# Patient Record
Sex: Female | Born: 1940
Health system: Southern US, Community
[De-identification: ages and names within clinical notes are randomized; demographics above are authoritative.]

## PROBLEM LIST (undated history)

## (undated) DIAGNOSIS — Z800 Family history of malignant neoplasm of digestive organs: Secondary | ICD-10-CM

## (undated) DIAGNOSIS — Z8042 Family history of malignant neoplasm of prostate: Secondary | ICD-10-CM

## (undated) DIAGNOSIS — I10 Essential (primary) hypertension: Secondary | ICD-10-CM

## (undated) DIAGNOSIS — B259 Cytomegaloviral disease, unspecified: Secondary | ICD-10-CM

## (undated) DIAGNOSIS — Z808 Family history of malignant neoplasm of other organs or systems: Secondary | ICD-10-CM

## (undated) DIAGNOSIS — Z803 Family history of malignant neoplasm of breast: Secondary | ICD-10-CM

## (undated) DIAGNOSIS — IMO0001 Reserved for inherently not codable concepts without codable children: Secondary | ICD-10-CM

## (undated) DIAGNOSIS — E7800 Pure hypercholesterolemia, unspecified: Secondary | ICD-10-CM

## (undated) DIAGNOSIS — K52831 Collagenous colitis: Secondary | ICD-10-CM

## (undated) HISTORY — DX: Pure hypercholesterolemia, unspecified: E78.00

## (undated) HISTORY — DX: Family history of malignant neoplasm of prostate: Z80.42

## (undated) HISTORY — DX: Family history of malignant neoplasm of breast: Z80.3

## (undated) HISTORY — PX: TOTAL ABDOMINAL HYSTERECTOMY W/ BILATERAL SALPINGOOPHORECTOMY: SHX83

## (undated) HISTORY — DX: Family history of malignant neoplasm of digestive organs: Z80.0

## (undated) HISTORY — DX: Essential (primary) hypertension: I10

## (undated) HISTORY — PX: APPENDECTOMY: SHX54

## (undated) HISTORY — DX: Collagenous colitis: K52.831

## (undated) HISTORY — PX: ROTATOR CUFF REPAIR: SHX139

## (undated) HISTORY — DX: Reserved for inherently not codable concepts without codable children: IMO0001

## (undated) HISTORY — DX: Family history of malignant neoplasm of other organs or systems: Z80.8

## (undated) HISTORY — DX: Cytomegaloviral disease, unspecified: B25.9

## (undated) HISTORY — PX: CATARACT EXTRACTION, BILATERAL: SHX1313

---

## 1970-06-08 HISTORY — PX: CHOLECYSTECTOMY: SHX55

## 1998-06-19 ENCOUNTER — Other Ambulatory Visit: Admission: RE | Admit: 1998-06-19 | Discharge: 1998-06-19 | Payer: Self-pay | Admitting: *Deleted

## 1998-10-29 ENCOUNTER — Inpatient Hospital Stay (HOSPITAL_COMMUNITY): Admission: RE | Admit: 1998-10-29 | Discharge: 1998-10-30 | Payer: Self-pay | Admitting: Orthopedic Surgery

## 2000-04-06 ENCOUNTER — Ambulatory Visit (HOSPITAL_COMMUNITY): Admission: RE | Admit: 2000-04-06 | Discharge: 2000-04-06 | Payer: Self-pay | Admitting: Gastroenterology

## 2000-06-21 ENCOUNTER — Other Ambulatory Visit: Admission: RE | Admit: 2000-06-21 | Discharge: 2000-06-21 | Payer: Self-pay | Admitting: *Deleted

## 2000-07-30 ENCOUNTER — Encounter: Admission: RE | Admit: 2000-07-30 | Discharge: 2000-07-30 | Payer: Self-pay | Admitting: Sports Medicine

## 2007-03-18 DIAGNOSIS — IMO0001 Reserved for inherently not codable concepts without codable children: Secondary | ICD-10-CM

## 2007-03-18 HISTORY — DX: Reserved for inherently not codable concepts without codable children: IMO0001

## 2010-02-03 ENCOUNTER — Ambulatory Visit: Payer: Self-pay | Admitting: Cardiology

## 2010-02-03 ENCOUNTER — Ambulatory Visit (HOSPITAL_COMMUNITY): Admission: RE | Admit: 2010-02-03 | Discharge: 2010-02-03 | Payer: Self-pay | Admitting: Cardiology

## 2010-02-03 DIAGNOSIS — IMO0001 Reserved for inherently not codable concepts without codable children: Secondary | ICD-10-CM

## 2010-02-03 HISTORY — DX: Reserved for inherently not codable concepts without codable children: IMO0001

## 2010-02-04 ENCOUNTER — Ambulatory Visit: Payer: Self-pay | Admitting: Cardiology

## 2010-10-07 ENCOUNTER — Other Ambulatory Visit: Payer: Self-pay | Admitting: *Deleted

## 2010-10-07 DIAGNOSIS — Z792 Long term (current) use of antibiotics: Secondary | ICD-10-CM

## 2010-10-07 NOTE — Telephone Encounter (Signed)
[place] the [place], requesting zpak

## 2010-10-08 MED ORDER — AZITHROMYCIN 250 MG PO TABS: ORAL_TABLET | ORAL | Status: AC

## 2010-10-24 NOTE — Procedures (Signed)
[place]. [facility]  Patient:    Stephanie Liu, Stephanie Liu                     MRN: [id number] Proc. Date: 04/06/00 Adm. Date:  [id number] Attending:  Rich Brave CC:         Colleen Can. Deborah Chalk, M.D.   Procedure Report  PROCEDURE PERFORMED:  Colonoscopy.  ENDOSCOPIST:  Florencia Reasons, M.D.  INDICATIONS FOR PROCEDURE:  The patient is a 70 year old female with a intermittent small volume hematochezia.  FINDINGS:  Normal exam to the cecum.  DESCRIPTION OF PROCEDURE:  The nature, purpose and risks of the procedure had been discussed with the patient, who provided written consent.  Sedation was fentanyl 60 mcg, Versed 7 mg IV without arrhythmias or desaturation.  The Olympus adjustable tension pediatric videocolonoscope was advanced quite easily to the base of the cecum as identified by the visualization of the appendiceal orifice, after which pullback was performed in a gradual fashion. The quality of the prep was excellent and it is felt that all areas were well seen.  This was a normal examination.  No polyps, cancer, colitis, vascular malformations or diverticulosis were observed and retroflexion in the rectum was normal.  No biopsies were obtained.  The patient tolerated the procedure well and there were no apparent complications.  IMPRESSION:  Normal colonoscopy.  Occasional rectal bleeding is presumably due to hemorrhoidal origin or perhaps intermittent anal fissuring, given the absence of alternative explanation on todays exam.  PLAN:  At the discretion of her primary physician, consideration could be given to a follow-up colonoscopy in about 10 years, perhaps with a flexible sigmoidoscopy in five years.  PLAN: Consider follow-up colonoscopy in five years in view of the family history of colon cancer. DD:  04/06/00 TD:  04/06/00 Job: [id number]

## 2011-08-24 ENCOUNTER — Encounter: Payer: Self-pay | Admitting: *Deleted

## 2011-08-24 DIAGNOSIS — E785 Hyperlipidemia, unspecified: Secondary | ICD-10-CM

## 2011-08-24 DIAGNOSIS — I10 Essential (primary) hypertension: Secondary | ICD-10-CM | POA: Insufficient documentation

## 2011-10-09 ENCOUNTER — Encounter: Payer: Self-pay | Admitting: Cardiology

## 2011-10-19 ENCOUNTER — Encounter: Payer: Self-pay | Admitting: Cardiology

## 2011-11-10 ENCOUNTER — Encounter: Payer: Self-pay | Admitting: Cardiology

## 2011-11-11 ENCOUNTER — Encounter: Payer: Self-pay | Admitting: Cardiology

## 2011-12-09 ENCOUNTER — Other Ambulatory Visit: Payer: Self-pay | Admitting: Nurse Practitioner

## 2011-12-09 MED ORDER — DOXYCYCLINE HYCLATE 100 MG PO TABS: 100.0000 mg | ORAL_TABLET | Freq: Two times a day (BID) | ORAL | Status: AC

## 2011-12-09 NOTE — Progress Notes (Signed)
Patient has had recent tick bite. Leaving for trip to [place]. Will give 14 days of Doxycycline as preventative therapy.

## 2012-10-07 ENCOUNTER — Encounter: Payer: Self-pay | Admitting: Cardiology

## 2014-07-19 ENCOUNTER — Encounter: Payer: Self-pay | Admitting: Cardiology

## 2015-06-13 MED FILL — BYSTOLIC 5 MG TABLET: 5 | 90 days supply | Qty: 90 | Fill #2

## 2015-06-20 DIAGNOSIS — Z780 Asymptomatic menopausal state: Secondary | ICD-10-CM | POA: Diagnosis not present

## 2015-07-03 DIAGNOSIS — D485 Neoplasm of uncertain behavior of skin: Secondary | ICD-10-CM | POA: Diagnosis not present

## 2015-07-03 DIAGNOSIS — L300 Nummular dermatitis: Secondary | ICD-10-CM | POA: Diagnosis not present

## 2015-07-03 DIAGNOSIS — L570 Actinic keratosis: Secondary | ICD-10-CM | POA: Diagnosis not present

## 2015-07-03 DIAGNOSIS — L814 Other melanin hyperpigmentation: Secondary | ICD-10-CM | POA: Diagnosis not present

## 2015-07-03 DIAGNOSIS — L821 Other seborrheic keratosis: Secondary | ICD-10-CM | POA: Diagnosis not present

## 2015-07-10 MED FILL — LOSARTAN-HCTZ 50-12.5 MG TA: 50-12.5 | 90 days supply | Qty: 90 | Fill #1

## 2015-07-11 DIAGNOSIS — Z1231 Encounter for screening mammogram for malignant neoplasm of breast: Secondary | ICD-10-CM | POA: Diagnosis not present

## 2015-10-08 DIAGNOSIS — H26493 Other secondary cataract, bilateral: Secondary | ICD-10-CM | POA: Diagnosis not present

## 2015-10-22 DIAGNOSIS — M25559 Pain in unspecified hip: Secondary | ICD-10-CM | POA: Diagnosis not present

## 2015-10-22 DIAGNOSIS — M25552 Pain in left hip: Secondary | ICD-10-CM | POA: Diagnosis not present

## 2015-10-22 DIAGNOSIS — M25562 Pain in left knee: Secondary | ICD-10-CM | POA: Diagnosis not present

## 2015-10-22 DIAGNOSIS — M25561 Pain in right knee: Secondary | ICD-10-CM | POA: Diagnosis not present

## 2016-02-19 DIAGNOSIS — N390 Urinary tract infection, site not specified: Secondary | ICD-10-CM | POA: Diagnosis not present

## 2016-02-19 DIAGNOSIS — E784 Other hyperlipidemia: Secondary | ICD-10-CM | POA: Diagnosis not present

## 2016-02-19 DIAGNOSIS — D7589 Other specified diseases of blood and blood-forming organs: Secondary | ICD-10-CM | POA: Diagnosis not present

## 2016-02-19 DIAGNOSIS — R8299 Other abnormal findings in urine: Secondary | ICD-10-CM | POA: Diagnosis not present

## 2016-02-19 DIAGNOSIS — I10 Essential (primary) hypertension: Secondary | ICD-10-CM | POA: Diagnosis not present

## 2016-02-26 DIAGNOSIS — H269 Unspecified cataract: Secondary | ICD-10-CM | POA: Diagnosis not present

## 2016-02-26 DIAGNOSIS — Z1389 Encounter for screening for other disorder: Secondary | ICD-10-CM | POA: Diagnosis not present

## 2016-02-26 DIAGNOSIS — Z6827 Body mass index (BMI) 27.0-27.9, adult: Secondary | ICD-10-CM | POA: Diagnosis not present

## 2016-02-26 DIAGNOSIS — D7589 Other specified diseases of blood and blood-forming organs: Secondary | ICD-10-CM | POA: Diagnosis not present

## 2016-02-26 DIAGNOSIS — R011 Cardiac murmur, unspecified: Secondary | ICD-10-CM | POA: Diagnosis not present

## 2016-02-26 DIAGNOSIS — M1711 Unilateral primary osteoarthritis, right knee: Secondary | ICD-10-CM | POA: Diagnosis not present

## 2016-02-26 DIAGNOSIS — Z0000 Encounter for general adult medical examination without abnormal findings: Secondary | ICD-10-CM | POA: Diagnosis not present

## 2016-02-26 DIAGNOSIS — Z23 Encounter for immunization: Secondary | ICD-10-CM | POA: Diagnosis not present

## 2016-02-26 DIAGNOSIS — I10 Essential (primary) hypertension: Secondary | ICD-10-CM | POA: Diagnosis not present

## 2016-02-26 DIAGNOSIS — M1712 Unilateral primary osteoarthritis, left knee: Secondary | ICD-10-CM | POA: Diagnosis not present

## 2016-02-26 DIAGNOSIS — E784 Other hyperlipidemia: Secondary | ICD-10-CM | POA: Diagnosis not present

## 2016-02-26 DIAGNOSIS — K2970 Gastritis, unspecified, without bleeding: Secondary | ICD-10-CM | POA: Diagnosis not present

## 2016-03-04 DIAGNOSIS — L308 Other specified dermatitis: Secondary | ICD-10-CM | POA: Diagnosis not present

## 2016-04-02 DIAGNOSIS — H26492 Other secondary cataract, left eye: Secondary | ICD-10-CM | POA: Diagnosis not present

## 2016-05-07 DIAGNOSIS — H26491 Other secondary cataract, right eye: Secondary | ICD-10-CM | POA: Diagnosis not present

## 2016-06-19 MED FILL — EZETIMIBE 10 MG TABLET: 10 | 90 days supply | Qty: 90 | Fill #0

## 2016-06-19 MED FILL — BYSTOLIC 5 MG TABLET: 5 | 90 days supply | Qty: 90 | Fill #0

## 2016-07-15 DIAGNOSIS — Z1231 Encounter for screening mammogram for malignant neoplasm of breast: Secondary | ICD-10-CM | POA: Diagnosis not present

## 2016-08-25 DIAGNOSIS — L821 Other seborrheic keratosis: Secondary | ICD-10-CM | POA: Diagnosis not present

## 2016-08-25 DIAGNOSIS — D692 Other nonthrombocytopenic purpura: Secondary | ICD-10-CM | POA: Diagnosis not present

## 2016-08-25 DIAGNOSIS — D1801 Hemangioma of skin and subcutaneous tissue: Secondary | ICD-10-CM | POA: Diagnosis not present

## 2016-08-25 DIAGNOSIS — L570 Actinic keratosis: Secondary | ICD-10-CM | POA: Diagnosis not present

## 2016-08-25 DIAGNOSIS — L309 Dermatitis, unspecified: Secondary | ICD-10-CM | POA: Diagnosis not present

## 2016-08-25 DIAGNOSIS — D2262 Melanocytic nevi of left upper limb, including shoulder: Secondary | ICD-10-CM | POA: Diagnosis not present

## 2016-08-25 DIAGNOSIS — L820 Inflamed seborrheic keratosis: Secondary | ICD-10-CM | POA: Diagnosis not present

## 2016-08-25 DIAGNOSIS — L578 Other skin changes due to chronic exposure to nonionizing radiation: Secondary | ICD-10-CM | POA: Diagnosis not present

## 2016-08-25 DIAGNOSIS — D2261 Melanocytic nevi of right upper limb, including shoulder: Secondary | ICD-10-CM | POA: Diagnosis not present

## 2016-08-25 MED FILL — TRIAMCINOLONE 0.1% CREAM: 0.1 | 15 days supply | Qty: 45 | Fill #0

## 2016-08-28 ENCOUNTER — Encounter: Payer: Self-pay | Admitting: Vascular Surgery

## 2016-08-31 ENCOUNTER — Other Ambulatory Visit: Payer: Self-pay

## 2016-08-31 DIAGNOSIS — I8390 Asymptomatic varicose veins of unspecified lower extremity: Secondary | ICD-10-CM

## 2016-09-04 MED FILL — TRIAMTERENE-HCTZ 37.5-25 MG: 37.5-25 | 90 days supply | Qty: 45 | Fill #0

## 2016-09-08 ENCOUNTER — Ambulatory Visit (INDEPENDENT_AMBULATORY_CARE_PROVIDER_SITE_OTHER): Payer: PPO | Admitting: Vascular Surgery

## 2016-09-08 ENCOUNTER — Encounter: Payer: Self-pay | Admitting: Vascular Surgery

## 2016-09-08 ENCOUNTER — Ambulatory Visit (HOSPITAL_COMMUNITY)
Admission: RE | Admit: 2016-09-08 | Discharge: 2016-09-08 | Disposition: A | Discharge status: Home / Self Care | Payer: PPO | Source: Ambulatory Visit | Attending: Vascular Surgery | Admitting: Vascular Surgery

## 2016-09-08 VITALS — BP 149/76 | HR 69 | Temp 97.6°F | Resp 16 | Ht 64.0 in | Wt 158.0 lb

## 2016-09-08 DIAGNOSIS — I8390 Asymptomatic varicose veins of unspecified lower extremity: Secondary | ICD-10-CM

## 2016-09-08 DIAGNOSIS — I83891 Varicose veins of right lower extremities with other complications: Secondary | ICD-10-CM | POA: Diagnosis not present

## 2016-09-08 DIAGNOSIS — I8391 Asymptomatic varicose veins of right lower extremity: Secondary | ICD-10-CM | POA: Diagnosis not present

## 2016-09-08 NOTE — Progress Notes (Signed)
Vascular and Vein Specialist of [facility]  Patient name: Stephanie Liu MRN: [id number] DOB: [birth year] Sex: female  REASON FOR CONSULT: Painful varicose veins in her right leg.  HPI: Stephanie Liu is a 75 y.o. female, who is seen today for evaluation of painful varicose veins in her right leg. These is been present for a number of years and she reports that they are progressive. She has large varices in the medial aspect of her right calf. She does have some [name] changes of hemosiderin deposit in the area around her ankle. She does notice swelling in her right leg and does not notice swelling in her left leg. She fortunately has had had no DVT and has not had any bleeding from her varices. He has warn knee-high and thigh-high graduated compression garments. She has not noted any significant relief with these. The thigh high compression were difficult to wear to keep from rolling down. She wore these mainly when traveling.  Past Medical History:  Diagnosis Date  . Normal nuclear stress test 03/18/2007  . Normal stress echocardiogram 02/03/2010   RBBB, CLINICALLY NEGATIVE FOR ANGINA, NORMAL LV FUNCTION WITH MILD LVH    Family History  Problem Relation Age of Onset  . Anemia Neg Hx   . Arrhythmia Neg Hx   . Asthma Neg Hx   . Clotting disorder Neg Hx   . Fainting Neg Hx   . Heart attack Neg Hx   . Heart disease Neg Hx   . Heart failure Neg Hx   . Hyperlipidemia Neg Hx   . Hypertension Neg Hx     SOCIAL HISTORY: Social History   Social History  . Marital status: Married    Spouse name: N/A  . Number of children: N/A  . Years of education: N/A   Occupational History  . Not on file.   Social History Main Topics  . Smoking status: Never Smoker  . Smokeless tobacco: Never Used  . Alcohol use Not on file  . Drug use: Unknown  . Sexual activity: Not on file   Other Topics Concern  . Not on file   Social History Narrative  . No  narrative on file    Allergies  Allergen Reactions  . Sulfa Antibiotics Rash    Current Outpatient Prescriptions  Medication Sig Dispense Refill  . aspirin 81 MG tablet Take 81 mg by mouth daily.    Marland Kitchen ezetimibe (ZETIA) 10 MG tablet Take 10 mg by mouth daily.    . nebivolol (BYSTOLIC) 5 MG tablet Take 5 mg by mouth daily.    Marland Kitchen triamterene-hydrochlorothiazide (DYAZIDE) 37.5-25 MG capsule Take 0.5 capsules by mouth daily.    Marland Kitchen atorvastatin (LIPITOR) 20 MG tablet Take 20 mg by mouth daily.    Marland Kitchen olmesartan-hydrochlorothiazide (BENICAR HCT) 20-12.5 MG per tablet Take 1 tablet by mouth daily.     No current facility-administered medications for this visit.     REVIEW OF SYSTEMS:  [X]  denotes positive finding, [ ]  denotes negative finding Cardiac  Comments:  Chest pain or chest pressure:    Shortness of breath upon exertion:    Short of breath when lying flat:    Irregular heart rhythm:        Vascular    Pain in calf, thigh, or hip brought on by ambulation:    Pain in feet at night that wakes you up from your sleep:     Blood clot in your veins:    Leg swelling:  x       Pulmonary    Oxygen at home:    Productive cough:     Wheezing:         Neurologic    Sudden weakness in arms or legs:     Sudden numbness in arms or legs:     Sudden onset of difficulty speaking or slurred speech:    Temporary loss of vision in one eye:     Problems with dizziness:         Gastrointestinal    Blood in stool:     Vomited blood:         Genitourinary    Burning when urinating:     Blood in urine:        Psychiatric    Major depression:         Hematologic    Bleeding problems:    Problems with blood clotting too easily:        Skin    Rashes or ulcers:        Constitutional    Fever or chills:      PHYSICAL EXAM: Vitals:   09/08/16 0935 09/08/16 0937  BP: (!) 158/76 (!) 149/76  Pulse: 69   Resp: 16   Temp: 97.6 F (36.4 C)   SpO2: 98%   Weight: 158 lb (71.7 kg)     Height: 5\' 4"  (1.626 m)     GENERAL: The patient is a well-nourished female, in no acute distress. The vital signs are documented above. CARDIOVASCULAR: 2+ dorsalis pedis pulses bilaterally. Large varices over the medial aspect of her right calf PULMONARY: There is good air exchange  ABDOMEN: Soft and non-tender  MUSCULOSKELETAL: There are no major deformities or cyanosis. NEUROLOGIC: No focal weakness or paresthesias are detected. SKIN: There are no ulcers or rashes noted. Some hyperpigmentation around the right ankle. PSYCHIATRIC: The patient has a normal affect.  DATA:  Venous duplex shows reflux throughout her great saphenous vein extending into the varicosities in her right medial calf. No significant reflux in her left great saphenous vein with no dilatation.  MEDICAL ISSUES: Discussed this at length with patient. I did image these with SonoSite ultrasound in the saphenous vein extends into these large varices. I did explain the importance of elevation and compression. She will wear her thigh-high graduated compression garments and elevate her legs when possible. Also explained the use of anti-inflammatory ibuprofen for symptom relief as well. We will see her again in 3 months to determine if this is helping her symptoms. She would be an excellent candidate for failure of conservative treatment. We will discuss this further in 3 months   Rosetta Posner, MD [facility] Vascular and Vein Specialists of [facility] Tel [phone] Pager [phone]

## 2016-09-23 MED FILL — BYSTOLIC 5 MG TABLET: 5 | 90 days supply | Qty: 90 | Fill #1

## 2016-09-23 MED FILL — EZETIMIBE 10 MG TAB: 10 | 90 days supply | Qty: 90 | Fill #1

## 2016-12-07 MED FILL — TRIAMTERENE-HCTZ 37.5-25 MG: 37.5-25 | 90 days supply | Qty: 45 | Fill #1

## 2016-12-10 DIAGNOSIS — M542 Cervicalgia: Secondary | ICD-10-CM | POA: Diagnosis not present

## 2016-12-11 ENCOUNTER — Encounter: Payer: Self-pay | Admitting: Vascular Surgery

## 2016-12-22 ENCOUNTER — Ambulatory Visit (INDEPENDENT_AMBULATORY_CARE_PROVIDER_SITE_OTHER): Payer: PPO | Admitting: Vascular Surgery

## 2016-12-22 ENCOUNTER — Encounter: Payer: Self-pay | Admitting: Vascular Surgery

## 2016-12-22 VITALS — BP 140/82 | HR 70 | Temp 97.4°F | Resp 16 | Ht 63.0 in | Wt 158.0 lb

## 2016-12-22 DIAGNOSIS — I83891 Varicose veins of right lower extremities with other complications: Secondary | ICD-10-CM | POA: Diagnosis not present

## 2016-12-22 NOTE — Progress Notes (Signed)
Vascular and Vein Specialist of [facility]  Patient name: Stephanie Liu MRN: [id number] DOB: [birth year] Sex: female  REASON FOR VISIT: follow-up  HPI:    Stephanie Liu is a 76 y.o. female who presents for follow-up of painful right lower extremity varicose veins. The patient was seen in the office on 09/08/16. At that time, she was recommended use of thigh high compression stockings, elevation and use of ibuprofen for pain. She still reports significant discomfort including pain, heaviness and aching of her right lower extremity despite these recommendations. She is here to discuss further options.   PAST MEDICAL HISTORY:   Past Medical History:  Diagnosis Date  . Normal nuclear stress test 03/18/2007  . Normal stress echocardiogram 02/03/2010   RBBB, CLINICALLY NEGATIVE FOR ANGINA, NORMAL LV FUNCTION WITH MILD LVH    Family History  Problem Relation Age of Onset  . Anemia Neg Hx   . Arrhythmia Neg Hx   . Asthma Neg Hx   . Clotting disorder Neg Hx   . Fainting Neg Hx   . Heart attack Neg Hx   . Heart disease Neg Hx   . Heart failure Neg Hx   . Hyperlipidemia Neg Hx   . Hypertension Neg Hx     Social History  Substance Use Topics  . Smoking status: Never Smoker  . Smokeless tobacco: Never Used  . Alcohol use Not on file    Allergies  Allergen Reactions  . Sulfa Antibiotics Rash    MEDICATIONS:   Current Outpatient Prescriptions  Medication Sig Dispense Refill  . aspirin 81 MG tablet Take 81 mg by mouth daily.    Marland Kitchen atorvastatin (LIPITOR) 20 MG tablet Take 20 mg by mouth daily.    Marland Kitchen ezetimibe (ZETIA) 10 MG tablet Take 10 mg by mouth daily.    . nebivolol (BYSTOLIC) 5 MG tablet Take 5 mg by mouth daily.    Marland Kitchen olmesartan-hydrochlorothiazide (BENICAR HCT) 20-12.5 MG per tablet Take 1 tablet by mouth daily.    Marland Kitchen triamterene-hydrochlorothiazide (DYAZIDE) 37.5-25 MG capsule Take 0.5 capsules by mouth daily.     No current facility-administered medications for this  visit.     REVIEW OF SYSTEMS:   REVIEW OF SYSTEMS (negative unless checked):   Cardiac:  []  Chest pain or chest pressure? []  Shortness of breath upon activity? []  Shortness of breath when lying flat? []  Irregular heart rhythm?  Vascular:  []  Pain in calf, thigh, or hip brought on by walking? []  Pain in feet at night that wakes you up from your sleep? []  Blood clot in your veins? [x]  Leg swelling?  Pulmonary:  []  Oxygen at home? []  Productive cough? []  Wheezing?  Neurologic:  []  Sudden weakness in arms or legs? []  Sudden numbness in arms or legs? []  Sudden onset of difficult speaking or slurred speech? []  Temporary loss of vision in one eye? []  Problems with dizziness?  Gastrointestinal:  []  Blood in stool? []  Vomited blood?  Genitourinary:  []  Burning when urinating? []  Blood in urine?  Psychiatric:  []  Major depression  Hematologic:  []  Bleeding problems? []  Problems with blood clotting?  Dermatologic:  []  Rashes or ulcers?  Constitutional:  []  Fever or chills?  Ear/Nose/Throat:  []  Change in hearing? []  Nose bleeds? []  Sore throat?  Musculoskeletal:  []  Back pain? []  Joint pain? []  Muscle pain?  PHYSICAL EXAM:   Vitals:   12/22/16 1258 12/22/16 1259  BP: (!) 145/80 140/82  Pulse: 70   Resp: 16  Temp: (!) 97.4 F (36.3 C)   SpO2: 96%   Weight: 158 lb (71.7 kg)   Height: 5\' 3"  (1.6 m)     GENERAL: The patient is a well-nourished female, in no acute distress. The vital signs are documented above. HEENT: normocephalic, atraumatic. No abnormalities noted.  CARDIAC: There is a regular rate and rhythm.  VASCULAR: Palpable right DP pulse. Large varicosity to right medial knee and right calf.  PULMONARY: There is good air exchange bilaterally without wheezing or rales. MUSCULOSKELETAL: There are no major deformities or cyanosis. NEUROLOGIC: No focal deficits.  SKIN: There are no ulcers or rashes noted. Hemosiderin staining around right  ankle.  PSYCHIATRIC: The patient has a normal affect.  DATA:    Venous duplex positive for reflux of right common femoral vein, right saphenofemoral junction and right great saphenous vein.  ASSESSMENT/PLAN:   Varicose veins right lower extremity with complications  The patient continues to have pain and discomfort despite conservative therapies. Plan for right great saphenous vein ablation with stab phlebectomies at the patient's convenience.   Virgina Jock, PA-C Vascular and Vein Specialists of [facility] MD: Early  I have examined the patient, reviewed and agree with above. Has been compliant with her compression garments but continues to have discomfort despite this. Does a great deal traveling makes is difficult with her leg pain. She would be an excellent candidate for ablation of her great saphenous vein. She does have true varicosities on the medial aspect of her calf I would recommend stab phlebectomy of these same setting. She understands procedures an outpatient under local anesthesia. We will proceed with this at her earliest convenience  Curt Jews, MD 12/22/2016 2:02 PM

## 2016-12-25 MED FILL — ROSUVASTATIN CALCIUM 10 MG: 10 | 90 days supply | Qty: 90 | Fill #0

## 2016-12-25 MED FILL — EZETIMIBE 10 MG TAB: 10 | 90 days supply | Qty: 90 | Fill #2

## 2016-12-25 MED FILL — BYSTOLIC 5 MG TABLET: 5 | 90 days supply | Qty: 90 | Fill #0

## 2017-01-05 ENCOUNTER — Other Ambulatory Visit: Payer: Self-pay | Admitting: *Deleted

## 2017-01-05 DIAGNOSIS — I83811 Varicose veins of right lower extremities with pain: Secondary | ICD-10-CM

## 2017-02-04 DIAGNOSIS — J309 Allergic rhinitis, unspecified: Secondary | ICD-10-CM | POA: Diagnosis not present

## 2017-02-04 DIAGNOSIS — Z6827 Body mass index (BMI) 27.0-27.9, adult: Secondary | ICD-10-CM | POA: Diagnosis not present

## 2017-02-04 DIAGNOSIS — I10 Essential (primary) hypertension: Secondary | ICD-10-CM | POA: Diagnosis not present

## 2017-02-04 DIAGNOSIS — R05 Cough: Secondary | ICD-10-CM | POA: Diagnosis not present

## 2017-02-04 MED FILL — predniSONE 10 MG TABS: 10 | 5 days supply | Qty: 10 | Fill #0

## 2017-02-04 MED FILL — AZITHROMYCIN 500 MG TABLET: 500 | 3 days supply | Qty: 3 | Fill #0

## 2017-02-16 ENCOUNTER — Encounter: Payer: Self-pay | Admitting: Vascular Surgery

## 2017-02-18 ENCOUNTER — Encounter: Payer: Self-pay | Admitting: Vascular Surgery

## 2017-02-18 ENCOUNTER — Ambulatory Visit (INDEPENDENT_AMBULATORY_CARE_PROVIDER_SITE_OTHER): Payer: PPO | Admitting: Vascular Surgery

## 2017-02-18 VITALS — BP 152/75 | HR 78 | Temp 97.4°F | Resp 16 | Ht 64.0 in | Wt 153.0 lb

## 2017-02-18 DIAGNOSIS — I868 Varicose veins of other specified sites: Secondary | ICD-10-CM

## 2017-02-18 DIAGNOSIS — I83891 Varicose veins of right lower extremities with other complications: Secondary | ICD-10-CM

## 2017-02-18 NOTE — Progress Notes (Signed)
     Laser Ablation Procedure    Date: 02/18/2017   Stephanie Liu DOB:Jun 18, 1940  Consent signed: Yes    Surgeon:  Dr. Sherren Mocha Early  Procedure: Laser Ablation: right Greater Saphenous Vein  BP (!) 152/75   Pulse 78   Temp (!) 97.4 F (36.3 C)   Resp 16   Ht 5\' 4"  (1.626 m)   Wt 153 lb (69.4 kg)   SpO2 94%   BMI 26.26 kg/m   Tumescent Anesthesia: 450 cc 0.9% NaCl with 50 cc Lidocaine HCL with 1% Epi and 15 cc 8.4% NaHCO3  Local Anesthesia: 6 cc Lidocaine HCL and NaHCO3 (ratio 2:1)  15 watts continuous mode        Total energy: 1778   Total time: 1:58   Stab Phlebectomy: 8 Sites: Calf  Patient tolerated procedure well  Notes:   Description of Procedure:  After marking the course of the secondary varicosities, the patient was placed on the operating table in the supine position, and the right leg was prepped and draped in sterile fashion.   Local anesthetic was administered and under ultrasound guidance the saphenous vein was accessed with a micro needle and guide wire; then the mirco puncture sheath was placed.  A guide wire was inserted saphenofemoral junction , followed by a 5 french sheath.  The position of the sheath and then the laser fiber below the junction was confirmed using the ultrasound.  Tumescent anesthesia was administered along the course of the saphenous vein using ultrasound guidance. The patient was placed in Trendelenburg position and protective laser glasses were placed on patient and staff, and the laser was fired at 15 watts continuous mode advancing 1-67mm/second for a total of 1778 joules.   For stab phlebectomies, local anesthetic was administered at the previously marked varicosities, and tumescent anesthesia was administered around the vessels.  Ten to 20 stab wounds were made using the tip of an 11 blade. And using the vein hook, the phlebectomies were performed using a hemostat to avulse the varicosities.  Adequate hemostasis was achieved.     Steri strips were applied to the stab wounds and ABD pads and thigh high compression stockings were applied.  Ace wrap bandages were applied over the phlebectomy sites and at the top of the saphenofemoral junction. Blood loss was less than 15 cc.  The patient ambulated out of the operating room having tolerated the procedure well.

## 2017-02-19 ENCOUNTER — Encounter: Payer: Self-pay | Admitting: Vascular Surgery

## 2017-02-23 MED FILL — AZITHROMYCIN 500 MG TABLET: 500 | 3 days supply | Qty: 3 | Fill #0

## 2017-02-25 ENCOUNTER — Encounter: Payer: Self-pay | Admitting: Vascular Surgery

## 2017-02-25 ENCOUNTER — Ambulatory Visit (INDEPENDENT_AMBULATORY_CARE_PROVIDER_SITE_OTHER): Payer: PPO | Admitting: Vascular Surgery

## 2017-02-25 ENCOUNTER — Ambulatory Visit (HOSPITAL_COMMUNITY)
Admission: RE | Admit: 2017-02-25 | Discharge: 2017-02-25 | Disposition: A | Discharge status: Home / Self Care | Payer: PPO | Source: Ambulatory Visit | Attending: Vascular Surgery | Admitting: Vascular Surgery

## 2017-02-25 VITALS — BP 131/78 | HR 64 | Temp 97.0°F | Resp 18 | Ht 64.0 in | Wt 153.4 lb

## 2017-02-25 DIAGNOSIS — I83891 Varicose veins of right lower extremities with other complications: Secondary | ICD-10-CM

## 2017-02-25 DIAGNOSIS — I82890 Acute embolism and thrombosis of other specified veins: Secondary | ICD-10-CM | POA: Insufficient documentation

## 2017-02-25 DIAGNOSIS — Z9889 Other specified postprocedural states: Secondary | ICD-10-CM | POA: Diagnosis not present

## 2017-02-25 DIAGNOSIS — I83811 Varicose veins of right lower extremities with pain: Secondary | ICD-10-CM | POA: Insufficient documentation

## 2017-02-25 NOTE — Progress Notes (Signed)
Vascular and Vein Specialist of [facility]  Patient name: Stephanie Liu MRN: [id number] DOB: 07-01-1940 Sex: female  REASON FOR VISIT: One-week follow-up of ablation of her right great saphenous vein.  HPI: Stephanie Liu is a 76 y.o. female she did quite well with minimal discomfort associated with this. Has had some bruising in her medial thigh. She did have several phlebectomy sites as well and some slight bruising around this as well. She has been playing with her compression garments.  Past Medical History:  Diagnosis Date  . Normal nuclear stress test 03/18/2007  . Normal stress echocardiogram 02/03/2010   RBBB, CLINICALLY NEGATIVE FOR ANGINA, NORMAL LV FUNCTION WITH MILD LVH    Family History  Problem Relation Age of Onset  . Anemia Neg Hx   . Arrhythmia Neg Hx   . Asthma Neg Hx   . Clotting disorder Neg Hx   . Fainting Neg Hx   . Heart attack Neg Hx   . Heart disease Neg Hx   . Heart failure Neg Hx   . Hyperlipidemia Neg Hx   . Hypertension Neg Hx     SOCIAL HISTORY: Social History  Substance Use Topics  . Smoking status: Never Smoker  . Smokeless tobacco: Never Used  . Alcohol use Not on file    Allergies  Allergen Reactions  . Sulfa Antibiotics Rash    Current Outpatient Prescriptions  Medication Sig Dispense Refill  . aspirin 81 MG tablet Take 81 mg by mouth 4 (four) times a week.     . ezetimibe (ZETIA) 10 MG tablet Take 10 mg by mouth daily.    . nebivolol (BYSTOLIC) 5 MG tablet Take 5 mg by mouth daily.    . rosuvastatin (CRESTOR) 5 MG tablet Take 5 mg by mouth daily.    Marland Kitchen triamterene-hydrochlorothiazide (DYAZIDE) 37.5-25 MG capsule Take 0.5 capsules by mouth daily.    Marland Kitchen atorvastatin (LIPITOR) 20 MG tablet Take 20 mg by mouth daily.    Marland Kitchen olmesartan-hydrochlorothiazide (BENICAR HCT) 20-12.5 MG per tablet Take 1 tablet by mouth daily.     No current facility-administered medications for this visit.      REVIEW OF SYSTEMS:  [X]  denotes positive finding, [ ]  denotes negative finding Cardiac  Comments:  Chest pain or chest pressure:    Shortness of breath upon exertion:    Short of breath when lying flat:    Irregular heart rhythm:        Vascular    Pain in calf, thigh, or hip brought on by ambulation:    Pain in feet at night that wakes you up from your sleep:     Blood clot in your veins:    Leg swelling:           PHYSICAL EXAM: Vitals:   02/25/17 0957  BP: 131/78  Pulse: 64  Resp: 18  Temp: (!) 97 F (36.1 C)  TempSrc: Oral  SpO2: 97%  Weight: 153 lb 6.4 oz (69.6 kg)  Height: 5\' 4"  (1.626 m)    GENERAL: The patient is a well-nourished female, in no acute distress. The vital signs are documented above. CARDIOVASCULAR: Mild bruising on the medial aspect of her thigh. No varicosities. Palpable thrombosed saphenous vein above her knee PULMONARY: There is good air exchange  MUSCULOSKELETAL: There are no major deformities or cyanosis. NEUROLOGIC: No focal weakness or paresthesias are detected. SKIN: There are no ulcers or rashes noted. PSYCHIATRIC: The patient has a normal affect.  DATA:  Duplex today shows closure of her great saphenous vein up to 1 cm from her saphenofemoral junction. Some mobility of the thrombus at the most proximal portion of her occluded saphenous vein  MEDICAL ISSUES: Stable one-week follow-up. We'll continue her compression for one additional week. Will then use her compression on as-needed basis. She is pleased with her result will see [place] again as needed    Rosetta Posner, MD [facility] Vascular and Vein Specialists of [facility] Tel [phone] Pager [phone]

## 2017-03-03 DIAGNOSIS — R5383 Other fatigue: Secondary | ICD-10-CM | POA: Diagnosis not present

## 2017-03-03 DIAGNOSIS — R7989 Other specified abnormal findings of blood chemistry: Secondary | ICD-10-CM | POA: Diagnosis not present

## 2017-03-03 DIAGNOSIS — K2970 Gastritis, unspecified, without bleeding: Secondary | ICD-10-CM | POA: Diagnosis not present

## 2017-03-03 DIAGNOSIS — N390 Urinary tract infection, site not specified: Secondary | ICD-10-CM | POA: Diagnosis not present

## 2017-03-03 DIAGNOSIS — R197 Diarrhea, unspecified: Secondary | ICD-10-CM | POA: Diagnosis not present

## 2017-03-03 DIAGNOSIS — R8299 Other abnormal findings in urine: Secondary | ICD-10-CM | POA: Diagnosis not present

## 2017-03-03 DIAGNOSIS — Z6827 Body mass index (BMI) 27.0-27.9, adult: Secondary | ICD-10-CM | POA: Diagnosis not present

## 2017-03-05 ENCOUNTER — Other Ambulatory Visit: Payer: Self-pay | Admitting: Internal Medicine

## 2017-03-05 ENCOUNTER — Ambulatory Visit
Admission: RE | Admit: 2017-03-05 | Discharge: 2017-03-05 | Disposition: A | Discharge status: Home / Self Care | Payer: PPO | Source: Ambulatory Visit | Attending: Internal Medicine | Admitting: Internal Medicine

## 2017-03-05 DIAGNOSIS — R509 Fever, unspecified: Secondary | ICD-10-CM | POA: Diagnosis not present

## 2017-03-05 DIAGNOSIS — R7989 Other specified abnormal findings of blood chemistry: Secondary | ICD-10-CM

## 2017-03-05 DIAGNOSIS — R945 Abnormal results of liver function studies: Secondary | ICD-10-CM

## 2017-03-05 DIAGNOSIS — R5383 Other fatigue: Secondary | ICD-10-CM | POA: Diagnosis not present

## 2017-03-05 DIAGNOSIS — K449 Diaphragmatic hernia without obstruction or gangrene: Secondary | ICD-10-CM | POA: Diagnosis not present

## 2017-03-05 DIAGNOSIS — R1084 Generalized abdominal pain: Secondary | ICD-10-CM

## 2017-03-05 DIAGNOSIS — R252 Cramp and spasm: Secondary | ICD-10-CM | POA: Diagnosis not present

## 2017-03-05 MED ORDER — IOPAMIDOL (ISOVUE-300) INJECTION 61%
100.0000 mL | Freq: Once | INTRAVENOUS | Status: AC | PRN
  Administered 2017-03-05: 100 mL via INTRAVENOUS

## 2017-03-08 DIAGNOSIS — N390 Urinary tract infection, site not specified: Secondary | ICD-10-CM | POA: Diagnosis not present

## 2017-03-08 DIAGNOSIS — R197 Diarrhea, unspecified: Secondary | ICD-10-CM | POA: Diagnosis not present

## 2017-03-08 DIAGNOSIS — R1084 Generalized abdominal pain: Secondary | ICD-10-CM | POA: Diagnosis not present

## 2017-03-09 MED FILL — DOXYCYCLINE HYCLATE 100 MG: 100 | 10 days supply | Qty: 20 | Fill #0

## 2017-03-10 DIAGNOSIS — R197 Diarrhea, unspecified: Secondary | ICD-10-CM | POA: Diagnosis not present

## 2017-03-10 MED FILL — TRIAMTERENE/HCTZ 37.5/25 TB: 37.5-25 | 90 days supply | Qty: 45 | Fill #2

## 2017-03-11 ENCOUNTER — Other Ambulatory Visit (HOSPITAL_COMMUNITY): Payer: Self-pay | Admitting: Gastroenterology

## 2017-03-11 DIAGNOSIS — R1084 Generalized abdominal pain: Secondary | ICD-10-CM

## 2017-03-11 DIAGNOSIS — R945 Abnormal results of liver function studies: Secondary | ICD-10-CM

## 2017-03-11 DIAGNOSIS — R740 Nonspecific elevation of levels of transaminase and lactic acid dehydrogenase [LDH]: Secondary | ICD-10-CM | POA: Diagnosis not present

## 2017-03-11 DIAGNOSIS — R5381 Other malaise: Secondary | ICD-10-CM | POA: Diagnosis not present

## 2017-03-11 DIAGNOSIS — R5383 Other fatigue: Secondary | ICD-10-CM | POA: Diagnosis not present

## 2017-03-11 DIAGNOSIS — R198 Other specified symptoms and signs involving the digestive system and abdomen: Secondary | ICD-10-CM | POA: Diagnosis not present

## 2017-03-14 ENCOUNTER — Other Ambulatory Visit (HOSPITAL_COMMUNITY): Payer: Self-pay | Admitting: Gastroenterology

## 2017-03-14 DIAGNOSIS — R945 Abnormal results of liver function studies: Secondary | ICD-10-CM

## 2017-03-14 DIAGNOSIS — R1084 Generalized abdominal pain: Secondary | ICD-10-CM

## 2017-03-15 ENCOUNTER — Ambulatory Visit (HOSPITAL_COMMUNITY)
Admission: RE | Admit: 2017-03-15 | Discharge: 2017-03-15 | Disposition: A | Discharge status: Home / Self Care | Payer: PPO | Source: Ambulatory Visit | Attending: Gastroenterology | Admitting: Gastroenterology

## 2017-03-15 ENCOUNTER — Encounter (HOSPITAL_COMMUNITY): Payer: Self-pay | Admitting: Radiology

## 2017-03-15 DIAGNOSIS — Z9049 Acquired absence of other specified parts of digestive tract: Secondary | ICD-10-CM | POA: Diagnosis not present

## 2017-03-15 DIAGNOSIS — R197 Diarrhea, unspecified: Secondary | ICD-10-CM | POA: Diagnosis not present

## 2017-03-15 DIAGNOSIS — R1084 Generalized abdominal pain: Secondary | ICD-10-CM | POA: Insufficient documentation

## 2017-03-15 DIAGNOSIS — Z6827 Body mass index (BMI) 27.0-27.9, adult: Secondary | ICD-10-CM | POA: Diagnosis not present

## 2017-03-15 DIAGNOSIS — R945 Abnormal results of liver function studies: Secondary | ICD-10-CM

## 2017-03-15 DIAGNOSIS — K449 Diaphragmatic hernia without obstruction or gangrene: Secondary | ICD-10-CM | POA: Diagnosis not present

## 2017-03-15 DIAGNOSIS — R7989 Other specified abnormal findings of blood chemistry: Secondary | ICD-10-CM | POA: Insufficient documentation

## 2017-03-15 DIAGNOSIS — R509 Fever, unspecified: Secondary | ICD-10-CM | POA: Diagnosis not present

## 2017-03-15 DIAGNOSIS — R932 Abnormal findings on diagnostic imaging of liver and biliary tract: Secondary | ICD-10-CM | POA: Diagnosis not present

## 2017-03-15 LAB — POCT I-STAT CREATININE: Creatinine, Ser: 0.7 mg/dL (ref 0.44–1.00)

## 2017-03-15 MED ORDER — GADOBENATE DIMEGLUMINE 529 MG/ML IV SOLN
15.0000 mL | Freq: Once | INTRAVENOUS | Status: AC | PRN
  Administered 2017-03-15: 14 mL via INTRAVENOUS

## 2017-03-16 MED ORDER — GADOBENATE DIMEGLUMINE 529 MG/ML IV SOLN
15.0000 mL | Freq: Once | INTRAVENOUS | Status: AC | PRN
  Administered 2017-03-16: 14 mL via INTRAVENOUS

## 2017-03-16 MED FILL — PEG-3350 SOLUTION: 420 | 1 days supply | Qty: 4000 | Fill #0

## 2017-03-19 DIAGNOSIS — K2930 Chronic superficial gastritis without bleeding: Secondary | ICD-10-CM | POA: Diagnosis not present

## 2017-03-19 DIAGNOSIS — K3189 Other diseases of stomach and duodenum: Secondary | ICD-10-CM | POA: Diagnosis not present

## 2017-03-19 DIAGNOSIS — K222 Esophageal obstruction: Secondary | ICD-10-CM | POA: Diagnosis not present

## 2017-03-19 DIAGNOSIS — R110 Nausea: Secondary | ICD-10-CM | POA: Diagnosis not present

## 2017-03-19 DIAGNOSIS — R197 Diarrhea, unspecified: Secondary | ICD-10-CM | POA: Diagnosis not present

## 2017-03-19 DIAGNOSIS — K633 Ulcer of intestine: Secondary | ICD-10-CM | POA: Diagnosis not present

## 2017-03-19 DIAGNOSIS — K2980 Duodenitis without bleeding: Secondary | ICD-10-CM | POA: Diagnosis not present

## 2017-03-19 DIAGNOSIS — B259 Cytomegaloviral disease, unspecified: Secondary | ICD-10-CM | POA: Diagnosis not present

## 2017-03-19 DIAGNOSIS — K6389 Other specified diseases of intestine: Secondary | ICD-10-CM | POA: Diagnosis not present

## 2017-03-25 DIAGNOSIS — R5383 Other fatigue: Secondary | ICD-10-CM | POA: Diagnosis not present

## 2017-03-25 DIAGNOSIS — R5381 Other malaise: Secondary | ICD-10-CM | POA: Diagnosis not present

## 2017-03-25 DIAGNOSIS — R198 Other specified symptoms and signs involving the digestive system and abdomen: Secondary | ICD-10-CM | POA: Diagnosis not present

## 2017-03-25 DIAGNOSIS — R740 Nonspecific elevation of levels of transaminase and lactic acid dehydrogenase [LDH]: Secondary | ICD-10-CM | POA: Diagnosis not present

## 2017-03-29 DIAGNOSIS — K2930 Chronic superficial gastritis without bleeding: Secondary | ICD-10-CM | POA: Diagnosis not present

## 2017-04-08 DIAGNOSIS — R82998 Other abnormal findings in urine: Secondary | ICD-10-CM | POA: Diagnosis not present

## 2017-04-08 DIAGNOSIS — E7849 Other hyperlipidemia: Secondary | ICD-10-CM | POA: Diagnosis not present

## 2017-04-08 DIAGNOSIS — I10 Essential (primary) hypertension: Secondary | ICD-10-CM | POA: Diagnosis not present

## 2017-04-13 MED FILL — BYSTOLIC 5 MG TABLET: 5 | 90 days supply | Qty: 90 | Fill #0

## 2017-04-22 MED FILL — EZETIMIBE 10 MG TABS: 10 | 90 days supply | Qty: 90 | Fill #3

## 2017-04-23 DIAGNOSIS — K52831 Collagenous colitis: Secondary | ICD-10-CM | POA: Diagnosis not present

## 2017-04-23 DIAGNOSIS — A0839 Other viral enteritis: Secondary | ICD-10-CM | POA: Diagnosis not present

## 2017-04-23 DIAGNOSIS — B259 Cytomegaloviral disease, unspecified: Secondary | ICD-10-CM | POA: Diagnosis not present

## 2017-05-19 DIAGNOSIS — Z0000 Encounter for general adult medical examination without abnormal findings: Secondary | ICD-10-CM | POA: Diagnosis not present

## 2017-05-19 DIAGNOSIS — Z23 Encounter for immunization: Secondary | ICD-10-CM | POA: Diagnosis not present

## 2017-05-19 DIAGNOSIS — M5030 Other cervical disc degeneration, unspecified cervical region: Secondary | ICD-10-CM | POA: Diagnosis not present

## 2017-05-19 DIAGNOSIS — R011 Cardiac murmur, unspecified: Secondary | ICD-10-CM | POA: Diagnosis not present

## 2017-05-19 DIAGNOSIS — Z6826 Body mass index (BMI) 26.0-26.9, adult: Secondary | ICD-10-CM | POA: Diagnosis not present

## 2017-05-19 DIAGNOSIS — R197 Diarrhea, unspecified: Secondary | ICD-10-CM | POA: Diagnosis not present

## 2017-05-19 DIAGNOSIS — R1084 Generalized abdominal pain: Secondary | ICD-10-CM | POA: Diagnosis not present

## 2017-05-19 DIAGNOSIS — Z780 Asymptomatic menopausal state: Secondary | ICD-10-CM | POA: Diagnosis not present

## 2017-05-19 DIAGNOSIS — E7849 Other hyperlipidemia: Secondary | ICD-10-CM | POA: Diagnosis not present

## 2017-05-19 DIAGNOSIS — K2970 Gastritis, unspecified, without bleeding: Secondary | ICD-10-CM | POA: Diagnosis not present

## 2017-05-19 DIAGNOSIS — R3121 Asymptomatic microscopic hematuria: Secondary | ICD-10-CM | POA: Diagnosis not present

## 2017-05-19 DIAGNOSIS — Z1389 Encounter for screening for other disorder: Secondary | ICD-10-CM | POA: Diagnosis not present

## 2017-05-19 DIAGNOSIS — I10 Essential (primary) hypertension: Secondary | ICD-10-CM | POA: Diagnosis not present

## 2017-05-19 MED FILL — METOPROLOL SUCC ER 25 MG TA: 25 | 90 days supply | Qty: 90 | Fill #0

## 2017-05-26 DIAGNOSIS — H52203 Unspecified astigmatism, bilateral: Secondary | ICD-10-CM | POA: Diagnosis not present

## 2017-05-26 DIAGNOSIS — H353131 Nonexudative age-related macular degeneration, bilateral, early dry stage: Secondary | ICD-10-CM | POA: Diagnosis not present

## 2017-05-26 DIAGNOSIS — Z961 Presence of intraocular lens: Secondary | ICD-10-CM | POA: Diagnosis not present

## 2017-06-24 MED FILL — SHINGRIX VIAL KIT: 50 | 1 days supply | Qty: 1 | Fill #0

## 2017-06-25 MED FILL — TRIAMTERENE/HCTZ 37.5/25 TB: 37.5-25 | 90 days supply | Qty: 45 | Fill #0

## 2017-07-02 DIAGNOSIS — K591 Functional diarrhea: Secondary | ICD-10-CM | POA: Diagnosis not present

## 2017-07-02 MED FILL — PEG-3350 SOLUTION: 420 | 1 days supply | Qty: 4000 | Fill #0

## 2017-07-07 DIAGNOSIS — R197 Diarrhea, unspecified: Secondary | ICD-10-CM | POA: Diagnosis not present

## 2017-07-07 DIAGNOSIS — K52831 Collagenous colitis: Secondary | ICD-10-CM | POA: Diagnosis not present

## 2017-07-12 DIAGNOSIS — K52831 Collagenous colitis: Secondary | ICD-10-CM | POA: Diagnosis not present

## 2017-07-12 MED FILL — BUDESONIDE 3 MG CPEP: 3 | 30 days supply | Qty: 90 | Fill #0

## 2017-07-13 ENCOUNTER — Other Ambulatory Visit: Payer: Self-pay | Admitting: Internal Medicine

## 2017-07-13 DIAGNOSIS — R7989 Other specified abnormal findings of blood chemistry: Secondary | ICD-10-CM

## 2017-07-13 DIAGNOSIS — R509 Fever, unspecified: Secondary | ICD-10-CM

## 2017-07-13 DIAGNOSIS — R945 Abnormal results of liver function studies: Secondary | ICD-10-CM

## 2017-07-13 DIAGNOSIS — R5383 Other fatigue: Secondary | ICD-10-CM

## 2017-07-13 DIAGNOSIS — R1084 Generalized abdominal pain: Secondary | ICD-10-CM

## 2017-07-26 DIAGNOSIS — Z1231 Encounter for screening mammogram for malignant neoplasm of breast: Secondary | ICD-10-CM | POA: Diagnosis not present

## 2017-07-30 DIAGNOSIS — N6311 Unspecified lump in the right breast, upper outer quadrant: Secondary | ICD-10-CM | POA: Diagnosis not present

## 2017-08-02 ENCOUNTER — Other Ambulatory Visit: Payer: Self-pay | Admitting: Radiology

## 2017-08-02 DIAGNOSIS — K52831 Collagenous colitis: Secondary | ICD-10-CM | POA: Diagnosis not present

## 2017-08-02 DIAGNOSIS — C50411 Malignant neoplasm of upper-outer quadrant of right female breast: Secondary | ICD-10-CM | POA: Diagnosis not present

## 2017-08-02 DIAGNOSIS — R928 Other abnormal and inconclusive findings on diagnostic imaging of breast: Secondary | ICD-10-CM | POA: Diagnosis not present

## 2017-08-05 MED FILL — EZETIMIBE 10 MG TABS: 10 | 90 days supply | Qty: 90 | Fill #0

## 2017-08-06 MED FILL — ROSUVASTATIN CALCIUM 10 MG: 10 | 90 days supply | Qty: 90 | Fill #0

## 2017-08-09 ENCOUNTER — Encounter: Payer: Self-pay | Admitting: Oncology

## 2017-08-09 ENCOUNTER — Telehealth: Payer: Self-pay | Admitting: Oncology

## 2017-08-09 ENCOUNTER — Other Ambulatory Visit: Payer: Self-pay | Admitting: General Surgery

## 2017-08-09 DIAGNOSIS — Z170 Estrogen receptor positive status [ER+]: Secondary | ICD-10-CM

## 2017-08-09 DIAGNOSIS — C50411 Malignant neoplasm of upper-outer quadrant of right female breast: Secondary | ICD-10-CM

## 2017-08-09 NOTE — Telephone Encounter (Signed)
Appt has been scheduled for the pt to see Dr. Jana Hakim on 3/11 at 4pm. Lft appt date and time on the pt's vm. Letter mailed.

## 2017-08-10 MED FILL — BUDESONIDE 3 MG CPEP: 3 | 30 days supply | Qty: 90 | Fill #1

## 2017-08-12 ENCOUNTER — Ambulatory Visit
Admission: RE | Admit: 2017-08-12 | Discharge: 2017-08-12 | Disposition: A | Discharge status: Home / Self Care | Payer: PPO | Source: Ambulatory Visit | Attending: General Surgery | Admitting: General Surgery

## 2017-08-12 DIAGNOSIS — C50911 Malignant neoplasm of unspecified site of right female breast: Secondary | ICD-10-CM | POA: Diagnosis not present

## 2017-08-12 DIAGNOSIS — C50411 Malignant neoplasm of upper-outer quadrant of right female breast: Secondary | ICD-10-CM | POA: Diagnosis not present

## 2017-08-12 DIAGNOSIS — Z170 Estrogen receptor positive status [ER+]: Secondary | ICD-10-CM

## 2017-08-12 MED ORDER — GADOBENATE DIMEGLUMINE 529 MG/ML IV SOLN
14.0000 mL | Freq: Once | INTRAVENOUS | Status: AC | PRN
  Administered 2017-08-12: 14 mL via INTRAVENOUS

## 2017-08-13 ENCOUNTER — Other Ambulatory Visit: Payer: Self-pay | Admitting: *Deleted

## 2017-08-13 DIAGNOSIS — C50919 Malignant neoplasm of unspecified site of unspecified female breast: Secondary | ICD-10-CM

## 2017-08-15 NOTE — Progress Notes (Signed)
[facility]  Telephone:(336) [phone] Fax:(336) [phone]     ID: Stephanie Liu DOB: 06/11/1940  MR#: [id number]  GYB#:[id number]  Patient Care Team: Stephanie Infante, MD as PCP - General (Internal Medicine) Stephanie Liu, Stephanie Dad, MD as Consulting Physician (Oncology) Stephanie Lobo, MD as Consulting Physician (Gastroenterology) Stephanie Bookbinder, MD as Consulting Physician (General Surgery) Stephanie Pray, MD as Consulting Physician (Radiation Oncology) Stephanie Maudlin, MD as Consulting Physician (Orthopedic Surgery) OTHER MD:  CHIEF COMPLAINT: Estrogen receptor positive breast cancer  CURRENT TREATMENT: Awaiting definitive surgery   HISTORY OF CURRENT ILLNESS: Stephanie Liu had routine screening mammography at [facility] on 07/26/2017 showing breast density category B.  There was a possible abnormality in the right breast. She underwent right breast ultrasonography on 07/30/2017 showing: a 1.3 cm irregular solid mass in there right breast upper outer quadrant highly suggestive of malignancy. An additional mass, possibly a lymph node in the right breast upper outer quadrant posterior depth was read as intermediate suspicion for malignancy.  Accordingly on 08/02/2017 Stephanie Liu proceeded to biopsy of the 1.3 cm right breast mas in question. The pathology from this procedure showed ([id number]):  invasive ductal carcinoma, grade 1, estrogen receptor 95% positive, progesterone receptor 80% positive, with an MIB-1 of 5%, and no HER-2 amplification, the signals ratio being 1.06 and the number per cell 1.90.Marland Kitchen   To further evaluate the second area of concern in the right breast she underwent breast MRI on 08/12/2017 with results showing: Known index cancer over the anterior third of the upper-outer right breast measuring 0.7 x 0.9 x 1.1 cm. Focus of indeterminate non mass enhancement over the posterior third of the outer lower right breast measuring 0.5 x 0.8 x 1.7 cm.  The patient's subsequent history is as  detailed below.  INTERVAL HISTORY: Stephanie Liu was evaluated in the breast clinic 08/16/2017 accompanied by her husband, Stephanie Liu. Her case will be presented at the multidisciplinary breast cancer conference on 08/18/2017.   REVIEW OF SYSTEMS: Stephanie Liu reports that there were no specific symptoms leading to the original mammogram, which was routinely scheduled. The patient denies unusual headaches, visual changes, nausea, vomiting, stiff neck, dizziness, or gait imbalance. There has been no cough, phlegm production, or pleurisy, no chest pain or pressure, and no change in bowel or bladder habits. The patient denies fever, rash, bleeding, unexplained fatigue or unexplained weight loss. A detailed review of systems was otherwise entirely negative.   PAST MEDICAL HISTORY: Past Medical History:  Diagnosis Date  . CMV infection ([place])   . Collagenous colitis   . HTN (hypertension)   . Hypercholesterolemia   . Normal nuclear stress test 03/18/2007  . Normal stress echocardiogram 02/03/2010   RBBB, CLINICALLY NEGATIVE FOR ANGINA, NORMAL LV FUNCTION WITH MILD LVH    PAST SURGICAL HISTORY: Past Surgical History:  Procedure Laterality Date  . APPENDECTOMY    . CATARACT EXTRACTION, BILATERAL    . CHOLECYSTECTOMY  1972  . ROTATOR CUFF REPAIR Right   . TOTAL ABDOMINAL HYSTERECTOMY W/ BILATERAL SALPINGOOPHORECTOMY      FAMILY HISTORY Family History  Problem Relation Age of Onset  . Anemia Neg Hx   . Arrhythmia Neg Hx   . Asthma Neg Hx   . Clotting disorder Neg Hx   . Fainting Neg Hx   . Heart attack Neg Hx   . Heart disease Neg Hx   . Heart failure Neg Hx   . Hyperlipidemia Neg Hx   . Hypertension Neg Hx   Her father had prostate cancer at  age 29. Her mother died from parkinson's and alzheimer at age 65. Her parents were immigrants from [place]. She has 2 sisters and 2 brothers. One of her brothers died last year. There is no other known family history of ovarian or breast cancer, but she has essentially  no information extended family  GYNECOLOGIC HISTORY:  No LMP recorded. Patient has had a hysterectomy. 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 Stephanie Liu, her husband is a Film/video editor.  He retired in 2017.  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  Their son, Stephanie Liu, is 67 year old and is a GI Physician in [place], [place].  The patient has has 5 grandchildren      ADVANCED DIRECTIVES:    HEALTH MAINTENANCE: Social History   Tobacco Use  . Smoking status: Never Smoker  . Smokeless tobacco: Never Used  Substance Use Topics  . Alcohol use: Not on file  . Drug use: Not on file     Colonoscopy: UTD  PAP: n/a  Bone density: UTD with Dr. Joylene Draft   Allergies  Allergen Reactions  . Codeine Hives  . Sulfa Antibiotics Rash    Current Outpatient Medications  Medication Sig Dispense Refill  . aspirin 81 MG tablet Take 81 mg by mouth 4 (four) times a week.     . budesonide (ENTOCORT EC) 3 MG 24 hr capsule Take 9 mg by mouth every morning.  5  . ezetimibe (ZETIA) 10 MG tablet Take 10 mg by mouth daily.    . nebivolol (BYSTOLIC) 5 MG tablet Take 5 mg by mouth daily.    . Probiotic Product (PROBIOTIC DAILY PO) Take by mouth.    . rosuvastatin (CRESTOR) 10 MG tablet Take 10 mg by mouth daily.  3  . rosuvastatin (CRESTOR) 5 MG tablet Take 5 mg by mouth daily.    Marland Kitchen triamterene-hydrochlorothiazide (DYAZIDE) 37.5-25 MG capsule Take 0.5 capsules by mouth daily.    . diazepam (VALIUM) 5 MG tablet Take 1 tablet (5 mg total) by mouth every 6 (six) hours as needed for anxiety. 4 tablet 0   No current facility-administered  medications for this visit.     OBJECTIVE: Middle-aged white woman who appears well  Vitals:   08/16/17 1557  BP: (!) 162/78  Pulse: 67  Resp: 17  Temp: 97.9 F (36.6 C)  SpO2: 99%     Body mass index is 26.33 kg/m.   Wt Readings from Last 3 Encounters:  08/16/17 153 lb 6.4 oz (69.6 kg)  02/25/17 153 lb 6.4 oz (69.6 kg)  02/18/17 153 lb (69.4 kg)      ECOG FS:0 - Asymptomatic  Ocular: Sclerae unicteric, pupils round and equal Ear-nose-throat: Oropharynx clear and moist Lymphatic: No cervical or supraclavicular adenopathy Lungs no rales or rhonchi Heart regular rate and rhythm Abd soft, nontender, positive bowel sounds MSK no focal spinal tenderness, no joint edema Neuro: non-focal, well-oriented, appropriate affect Breasts: I do not palpate a mass in the right breast.  There are no skin or nipple changes of concern.  The left breast is benign.  Both axillae are benign.   LAB RESULTS:  CMP     Component Value Date/Time   NA 136 08/16/2017 1545   K 4.8 08/16/2017 1545   CL 99 08/16/2017 1545   CO2 30 (H) 08/16/2017 1545   GLUCOSE 98 08/16/2017 1545   BUN 18 08/16/2017 1545   CREATININE 0.84 08/16/2017 1545   CALCIUM 9.8 08/16/2017 1545   PROT 7.4 08/16/2017 1545   ALBUMIN 3.9 08/16/2017 1545   AST 29 08/16/2017 1545   ALT 38 08/16/2017 1545   ALKPHOS 57 08/16/2017 1545   BILITOT 0.3 08/16/2017 1545   GFRNONAA >60 08/16/2017 1545   GFRAA >60 08/16/2017 1545    No results found for: TOTALPROTELP, ALBUMINELP, A1GS, A2GS, BETS, BETA2SER, GAMS, MSPIKE, SPEI  No results found for: KPAFRELGTCHN, LAMBDASER, KAPLAMBRATIO  Lab Results  Component Value Date   WBC 9.0 08/16/2017   NEUTROABS 6.0 08/16/2017   HCT 45.1 08/16/2017   MCV 92.8 08/16/2017   PLT 225 08/16/2017    @LASTCHEMISTRY @  No results found for: LABCA2  No components found for: GNFAOZ308  No results for input(s): INR in the last 168 hours.  No results found for: LABCA2  No results found  for: MVH846  No results found for: NGE952  No results found for: WUX324  No results found for: CA2729  No components found for: HGQUANT  No results found for: CEA1 / No results found for: CEA1   No results found for: AFPTUMOR  No results found for: CHROMOGRNA  No results found for: PSA1  Appointment on 08/16/2017  Component Date Value Ref Range Status  . Sodium 08/16/2017 136  136 - 145 mmol/L Final  . Potassium 08/16/2017 4.8  3.5 - 5.1 mmol/L Final  . Chloride 08/16/2017 99  98 - 109 mmol/L Final  . CO2 08/16/2017 30* 22 - 29 mmol/L Final  . Glucose, Bld 08/16/2017 98  70 - 140 mg/dL Final  . BUN 08/16/2017 18  7 - 26 mg/dL Final  . Creatinine 08/16/2017 0.84  0.60 - 1.10 mg/dL Final  . Calcium 08/16/2017 9.8  8.4 - 10.4 mg/dL Final  . Total Protein 08/16/2017 7.4  6.4 - 8.3 g/dL Final  . Albumin 08/16/2017 3.9  3.5 - 5.0 g/dL Final  . AST 08/16/2017 29  5 - 34 U/L Final  . ALT 08/16/2017 38  0 - 55 U/L Final  . Alkaline Phosphatase 08/16/2017 57  40 - 150 U/L Final  . Total Bilirubin 08/16/2017 0.3  0.2 - 1.2 mg/dL Final  . GFR, Est Non Af Am 08/16/2017 >60  >60 mL/min Final  . GFR, Est AFR Am 08/16/2017 >60  >60 mL/min Final   Comment: (NOTE) The eGFR has been calculated using the CKD EPI equation. This calculation has not been validated in all clinical situations. eGFR's persistently <60 mL/min signify possible Chronic Kidney Disease.   Georgiann Hahn gap 08/16/2017 7  3 - 11 Final   Performed at [facility], [place] [address], [place], [place] [address]  . WBC Count 08/16/2017 9.0  3.9 - 10.3 K/uL Final  . RBC 08/16/2017 4.86  3.70 - 5.45 MIL/uL Final  . Hemoglobin 08/16/2017 15.2  11.6 - 15.9 g/dL Final  . HCT 08/16/2017 45.1  34.8 - 46.6 % Final  . MCV 08/16/2017 92.8  79.5 - 101.0 fL Final  . MCH 08/16/2017 31.3  25.1 - 34.0 pg Final  . MCHC 08/16/2017 33.7  31.5 - 36.0 g/dL Final  . RDW 08/16/2017 13.1  11.2 - 14.5 % Final  .  Platelet  Count 08/16/2017 225  145 - 400 K/uL Final  . Neutrophils Relative % 08/16/2017 67  % Final  . Neutro Abs 08/16/2017 6.0  1.5 - 6.5 K/uL Final  . Lymphocytes Relative 08/16/2017 26  % Final  . Lymphs Abs 08/16/2017 2.4  0.9 - 3.3 K/uL Final  . Monocytes Relative 08/16/2017 6  % Final  . Monocytes Absolute 08/16/2017 0.5  0.1 - 0.9 K/uL Final  . Eosinophils Relative 08/16/2017 1  % Final  . Eosinophils Absolute 08/16/2017 0.1  0.0 - 0.5 K/uL Final  . Basophils Relative 08/16/2017 0  % Final  . Basophils Absolute 08/16/2017 0.0  0.0 - 0.1 K/uL Final   Performed at [facility], [place] [address] [place], [place] [address]    (this displays the last labs from the last 3 days)  No results found for: TOTALPROTELP, ALBUMINELP, A1GS, A2GS, BETS, BETA2SER, GAMS, MSPIKE, SPEI (this displays SPEP labs)  No results found for: KPAFRELGTCHN, LAMBDASER, KAPLAMBRATIO (kappa/lambda light chains)  No results found for: HGBA, HGBA2QUANT, HGBFQUANT, HGBSQUAN (Hemoglobinopathy evaluation)   No results found for: LDH  No results found for: IRON, TIBC, IRONPCTSAT (Iron and TIBC)  No results found for: FERRITIN  Urinalysis No results found for: COLORURINE, APPEARANCEUR, LABSPEC, PHURINE, GLUCOSEU, HGBUR, BILIRUBINUR, KETONESUR, PROTEINUR, UROBILINOGEN, NITRITE, LEUKOCYTESUR   STUDIES: Mr Breast Bilateral W Cliff Cad  Result Date: 08/13/2017 CLINICAL DATA:  Recent biopsy-proven invasive ductal carcinoma stage IIB upper outer quadrant right breast. Evaluate extent of disease. LABS:  Creatinine was obtained on site at [place] at [address] Results: Creatinine 0.6 mg/dL.  GFR 88. EXAM: BILATERAL BREAST MRI WITH AND WITHOUT CONTRAST TECHNIQUE: Multiplanar, multisequence MR images of both breasts were obtained prior to and following the intravenous administration of 14 ml of MultiHance. THREE-DIMENSIONAL MR IMAGE RENDERING ON INDEPENDENT WORKSTATION:  Three-dimensional MR images were rendered by post-processing of the original MR data on an independent workstation. The three-dimensional MR images were interpreted, and findings are reported in the following complete MRI report for this study. Three dimensional images were evaluated at the independent DynaCad workstation COMPARISON:  Previous exam(s). FINDINGS: Breast composition: b. Scattered fibroglandular tissue. Background parenchymal enhancement: Mild. Right breast: Examination demonstrates evidence of patient's index cancer over the anterior third of the upper-outer quadrant of the right breast measuring approximately 0.7 x 0.9 x 1.1 cm with associated clip artifact and mild post biopsy change. There is a focus of non mass enhancement over the posterior third of the outer lower right breast measuring 0.5 x 0.8 x 1.1 cm. Left breast: No mass or abnormal enhancement. Lymph nodes: No abnormal appearing lymph nodes over the axillary regions or internal mammary chain. Ancillary findings:  None. IMPRESSION: Known index cancer over the anterior third of the upper-outer right breast measuring 0.7 x 0.9 x 1.1 cm. Focus of indeterminate non mass enhancement over the posterior third of the outer lower right breast measuring 0.5 x 0.8 x 1.7 cm. RECOMMENDATION: Recommend MRI guided biopsy of the indeterminate non mass enhancement over the posterior third of the outer lower right breast. BI-RADS CATEGORY  4: Suspicious. Electronically Signed   By: Marin Olp M.D.   On: 08/13/2017 12:06    ELIGIBLE FOR AVAILABLE RESEARCH PROTOCOL:   ASSESSMENT: 77 y.o.-year-old [place] woman status post right breast upper outer quadrant biopsy 08/02/2017 for a clinical T1c N0, stage IA invasive ductal carcinoma, grade 1, estrogen and progesterone receptor positive, with an MIB-105% and no  HER-2 amplification.  (a) MRI biopsy of second area in the right breast pending  (1) definitive surgery pending  (2) Oncotype DX to be  sent from the final surgical procedure: Chemotherapy not anticipated  (3) consider adjuvant radiation  (4) antiestrogens to follow at the completion of local treatment  (5) genetics testing  PLAN: We spent the better part of today's hour-long appointment discussing the biology of her diagnosis and the specifics of her situation. We first reviewed the fact that cancer is not one disease but more than 100 different diseases and that it is important to keep them separate-- otherwise when friends and relatives discuss their own cancer experiences with [place] confusion can result. Similarly we explained that if breast cancer spreads to the bone or liver, the patient would not have bone cancer or liver cancer, but breast cancer in the bone and breast cancer in the liver: one cancer in three places-- not 3 different cancers which otherwise would have to be treated in 3 different ways.  We discussed the difference between local and systemic therapy. In terms of loco-regional treatment, lumpectomy plus radiation is equivalent to mastectomy as far as survival is concerned. For this reason, and because the cosmetic results are generally superior, we recommend breast conserving surgery.  In Anola's case, we are waiting on results of an MRI biopsy (order placed today) to plan the definitive surgery  We then discussed the rationale for systemic therapy. There is some risk that this cancer may have already spread to other parts of her body. Patients frequently ask at this point about bone scans, CAT scans and PET scans to find out if they have occult breast cancer somewhere else. The problem is that in early stage disease we are much more likely to find false positives then true cancers and this would expose the patient to unnecessary procedures as well as unnecessary radiation. Scans cannot answer the question the patient really would like to know, which is whether she has microscopic disease elsewhere in her body. For  those reasons we do not recommend them.  Of course we would proceed to aggressive evaluation of any symptoms that might suggest metastatic disease, but that is not the case here.  Next we went over the options for systemic therapy which are anti-estrogens, anti-HER-2 immunotherapy, and chemotherapy. Deone does not meet criteria for anti-HER-2 immunotherapy. She is a good candidate for anti-estrogens.  The question of chemotherapy is more complicated. Chemotherapy is most effective in rapidly growing, aggressive tumors. It is much less effective in low-grade, slow growing cancers, like Murial 's. For that reason we are going to request an Oncotype from the definitive surgical sample, as suggested by NCCN guidelines.  Mailen understands that I expect the results to come back a "low risk", which means she would not benefit from chemotherapy, but we actually will not have that data until 2 weeks postop  They are interested in having Dr. Sondra Come as their radiation oncologist.  They understand that radiation may or may not be necessary partly depending on surgical results and they will discuss this with him after the surgery  [facility] is also interested in genetics testing.  In patients who carry a deleterious mutation [for example in a  BRCA gene], the risk of a new breast cancer developing in the future may be sufficiently great that the patient may choose bilateral mastectomies. However if she wishes to keep her breasts in that situation it is safe to do so. That would require intensified  screening, which generally means not only yearly mammography but a yearly breast MRI as well. Of course, if there is a deleterious mutation bilateral oophorectomy would be necessary as there is no standard screening protocol for ovarian cancer.  She does not like the idea of proceeding to MRI guided biopsy but after reviewing the MRI images she understands the need for that.  I went ahead and wrote her for some Valium which will be  helpful in getting through that process.  She will then meet with Dr. Donne Hazel to discuss results and make a definitive surgical decision  Paraskevi has a good understanding of the overall plan. She agrees with it. She knows the goal of treatment in her case is cure. She will call with any problems that may develop before her next visit here.  Avante Carneiro, Stephanie Dad, MD  08/16/17 5:03 PM Medical Oncology and Hematology [facility] [address] [place], [place] [address] Tel. [phone]    Fax. [phone]    This document serves as a record of services personally performed by Lurline Del, MD. It was created on his behalf by Steva Colder, a trained medical scribe. The creation of this record is based on the scribe's personal observations and the provider's statements to them.   I have reviewed the above documentation for accuracy and completeness, and I agree with the above.

## 2017-08-16 ENCOUNTER — Inpatient Hospital Stay: Payer: PPO | Attending: Oncology | Admitting: Oncology

## 2017-08-16 ENCOUNTER — Inpatient Hospital Stay: Payer: PPO

## 2017-08-16 ENCOUNTER — Encounter: Payer: Self-pay | Admitting: *Deleted

## 2017-08-16 ENCOUNTER — Encounter: Payer: Self-pay | Admitting: Oncology

## 2017-08-16 DIAGNOSIS — Z170 Estrogen receptor positive status [ER+]: Secondary | ICD-10-CM | POA: Diagnosis not present

## 2017-08-16 DIAGNOSIS — C50411 Malignant neoplasm of upper-outer quadrant of right female breast: Secondary | ICD-10-CM | POA: Insufficient documentation

## 2017-08-16 DIAGNOSIS — C50919 Malignant neoplasm of unspecified site of unspecified female breast: Secondary | ICD-10-CM

## 2017-08-16 LAB — CBC WITH DIFFERENTIAL (CANCER CENTER ONLY)
Basophils Absolute: 0 10*3/uL (ref 0.0–0.1)
Basophils Relative: 0 %
Eosinophils Absolute: 0.1 10*3/uL (ref 0.0–0.5)
Eosinophils Relative: 1 %
HCT: 45.1 % (ref 34.8–46.6)
Hemoglobin: 15.2 g/dL (ref 11.6–15.9)
Lymphocytes Relative: 26 %
Lymphs Abs: 2.4 10*3/uL (ref 0.9–3.3)
MCH: 31.3 pg (ref 25.1–34.0)
MCHC: 33.7 g/dL (ref 31.5–36.0)
MCV: 92.8 fL (ref 79.5–101.0)
Monocytes Absolute: 0.5 10*3/uL (ref 0.1–0.9)
Monocytes Relative: 6 %
Neutro Abs: 6 10*3/uL (ref 1.5–6.5)
Neutrophils Relative %: 67 %
Platelet Count: 225 10*3/uL (ref 145–400)
RBC: 4.86 MIL/uL (ref 3.70–5.45)
RDW: 13.1 % (ref 11.2–14.5)
WBC Count: 9 10*3/uL (ref 3.9–10.3)

## 2017-08-16 LAB — CMP (CANCER CENTER ONLY)
ALT: 38 U/L (ref 0–55)
AST: 29 U/L (ref 5–34)
Albumin: 3.9 g/dL (ref 3.5–5.0)
Alkaline Phosphatase: 57 U/L (ref 40–150)
Anion gap: 7 (ref 3–11)
BUN: 18 mg/dL (ref 7–26)
CO2: 30 mmol/L — ABNORMAL HIGH (ref 22–29)
Calcium: 9.8 mg/dL (ref 8.4–10.4)
Chloride: 99 mmol/L (ref 98–109)
Creatinine: 0.84 mg/dL (ref 0.60–1.10)
GFR, Est AFR Am: >60 mL/min (ref >60)
GFR, Estimated: >60 mL/min (ref >60)
Glucose, Bld: 98 mg/dL (ref 70–140)
Potassium: 4.8 mmol/L (ref 3.5–5.1)
Sodium: 136 mmol/L (ref 136–145)
Total Bilirubin: 0.3 mg/dL (ref 0.2–1.2)
Total Protein: 7.4 g/dL (ref 6.4–8.3)

## 2017-08-16 MED ORDER — DIAZEPAM 5 MG PO TABS: 5.0000 mg | ORAL_TABLET | Freq: Four times a day (QID) | ORAL | 0 refills | Status: DC | PRN

## 2017-08-17 ENCOUNTER — Other Ambulatory Visit: Payer: Self-pay | Admitting: Oncology

## 2017-08-17 DIAGNOSIS — Z170 Estrogen receptor positive status [ER+]: Secondary | ICD-10-CM

## 2017-08-17 DIAGNOSIS — C50411 Malignant neoplasm of upper-outer quadrant of right female breast: Secondary | ICD-10-CM

## 2017-08-17 MED FILL — diazePAM 5 MG TABS: 5 | 1 days supply | Qty: 4 | Fill #0

## 2017-08-18 ENCOUNTER — Telehealth: Payer: Self-pay | Admitting: Oncology

## 2017-08-18 NOTE — Telephone Encounter (Signed)
Per 3/11 already done

## 2017-08-26 ENCOUNTER — Ambulatory Visit
Admission: RE | Admit: 2017-08-26 | Discharge: 2017-08-26 | Disposition: A | Discharge status: Home / Self Care | Payer: PPO | Source: Ambulatory Visit | Attending: Oncology | Admitting: Oncology

## 2017-08-26 DIAGNOSIS — N6489 Other specified disorders of breast: Secondary | ICD-10-CM | POA: Diagnosis not present

## 2017-08-26 DIAGNOSIS — C50411 Malignant neoplasm of upper-outer quadrant of right female breast: Secondary | ICD-10-CM

## 2017-08-26 DIAGNOSIS — Z170 Estrogen receptor positive status [ER+]: Secondary | ICD-10-CM

## 2017-08-26 MED ORDER — GADOBENATE DIMEGLUMINE 529 MG/ML IV SOLN: 14.0000 mL | Freq: Once | INTRAVENOUS | Status: DC | PRN

## 2017-09-01 MED FILL — SHINGRIX VIAL KIT: 50 | 1 days supply | Qty: 1 | Fill #1

## 2017-09-02 ENCOUNTER — Other Ambulatory Visit: Payer: Self-pay | Admitting: General Surgery

## 2017-09-02 DIAGNOSIS — Z170 Estrogen receptor positive status [ER+]: Secondary | ICD-10-CM

## 2017-09-02 DIAGNOSIS — C50411 Malignant neoplasm of upper-outer quadrant of right female breast: Secondary | ICD-10-CM

## 2017-09-02 DIAGNOSIS — N6489 Other specified disorders of breast: Secondary | ICD-10-CM

## 2017-09-07 ENCOUNTER — Encounter (HOSPITAL_BASED_OUTPATIENT_CLINIC_OR_DEPARTMENT_OTHER): Payer: Self-pay | Admitting: *Deleted

## 2017-09-08 ENCOUNTER — Other Ambulatory Visit: Payer: Self-pay

## 2017-09-08 ENCOUNTER — Encounter (HOSPITAL_BASED_OUTPATIENT_CLINIC_OR_DEPARTMENT_OTHER)
Admission: RE | Admit: 2017-09-08 | Discharge: 2017-09-08 | Disposition: A | Discharge status: Home / Self Care | Payer: PPO | Source: Ambulatory Visit | Attending: General Surgery | Admitting: General Surgery

## 2017-09-08 DIAGNOSIS — I4510 Unspecified right bundle-branch block: Secondary | ICD-10-CM | POA: Insufficient documentation

## 2017-09-08 DIAGNOSIS — Z01810 Encounter for preprocedural cardiovascular examination: Secondary | ICD-10-CM | POA: Insufficient documentation

## 2017-09-08 DIAGNOSIS — I10 Essential (primary) hypertension: Secondary | ICD-10-CM | POA: Insufficient documentation

## 2017-09-08 DIAGNOSIS — Z01812 Encounter for preprocedural laboratory examination: Secondary | ICD-10-CM | POA: Insufficient documentation

## 2017-09-08 DIAGNOSIS — R9431 Abnormal electrocardiogram [ECG] [EKG]: Secondary | ICD-10-CM | POA: Insufficient documentation

## 2017-09-08 NOTE — Pre-Procedure Instructions (Signed)
Dr.Hollis Reviewed EKG - ok for surgery. Pt given Ensure and instructed to drink by 10:00 am day of surgery with teach back method.

## 2017-09-10 DIAGNOSIS — C50411 Malignant neoplasm of upper-outer quadrant of right female breast: Secondary | ICD-10-CM | POA: Diagnosis not present

## 2017-09-10 DIAGNOSIS — N6089 Other benign mammary dysplasias of unspecified breast: Secondary | ICD-10-CM | POA: Diagnosis not present

## 2017-09-13 ENCOUNTER — Encounter (HOSPITAL_BASED_OUTPATIENT_CLINIC_OR_DEPARTMENT_OTHER)
Admission: RE | Disposition: A | Discharge status: Home / Self Care | Payer: Self-pay | Source: Ambulatory Visit | Attending: General Surgery

## 2017-09-13 ENCOUNTER — Ambulatory Visit (HOSPITAL_BASED_OUTPATIENT_CLINIC_OR_DEPARTMENT_OTHER)
Admission: RE | Admit: 2017-09-13 | Discharge: 2017-09-13 | Disposition: A | Discharge status: Home / Self Care | Payer: PPO | Source: Ambulatory Visit | Attending: General Surgery | Admitting: General Surgery

## 2017-09-13 ENCOUNTER — Encounter (HOSPITAL_BASED_OUTPATIENT_CLINIC_OR_DEPARTMENT_OTHER): Payer: Self-pay

## 2017-09-13 ENCOUNTER — Ambulatory Visit (HOSPITAL_BASED_OUTPATIENT_CLINIC_OR_DEPARTMENT_OTHER): Payer: PPO | Admitting: Certified Registered"

## 2017-09-13 ENCOUNTER — Other Ambulatory Visit: Payer: Self-pay

## 2017-09-13 DIAGNOSIS — N6041 Mammary duct ectasia of right breast: Secondary | ICD-10-CM | POA: Insufficient documentation

## 2017-09-13 DIAGNOSIS — Z808 Family history of malignant neoplasm of other organs or systems: Secondary | ICD-10-CM | POA: Diagnosis not present

## 2017-09-13 DIAGNOSIS — I10 Essential (primary) hypertension: Secondary | ICD-10-CM | POA: Diagnosis not present

## 2017-09-13 DIAGNOSIS — Z811 Family history of alcohol abuse and dependence: Secondary | ICD-10-CM | POA: Insufficient documentation

## 2017-09-13 DIAGNOSIS — N6031 Fibrosclerosis of right breast: Secondary | ICD-10-CM | POA: Diagnosis not present

## 2017-09-13 DIAGNOSIS — Z7982 Long term (current) use of aspirin: Secondary | ICD-10-CM | POA: Insufficient documentation

## 2017-09-13 DIAGNOSIS — C50911 Malignant neoplasm of unspecified site of right female breast: Secondary | ICD-10-CM | POA: Diagnosis not present

## 2017-09-13 DIAGNOSIS — Z170 Estrogen receptor positive status [ER+]: Secondary | ICD-10-CM | POA: Diagnosis not present

## 2017-09-13 DIAGNOSIS — Z79899 Other long term (current) drug therapy: Secondary | ICD-10-CM | POA: Diagnosis not present

## 2017-09-13 DIAGNOSIS — Z8249 Family history of ischemic heart disease and other diseases of the circulatory system: Secondary | ICD-10-CM | POA: Insufficient documentation

## 2017-09-13 DIAGNOSIS — N6011 Diffuse cystic mastopathy of right breast: Secondary | ICD-10-CM | POA: Diagnosis not present

## 2017-09-13 DIAGNOSIS — C50411 Malignant neoplasm of upper-outer quadrant of right female breast: Secondary | ICD-10-CM | POA: Insufficient documentation

## 2017-09-13 DIAGNOSIS — Z885 Allergy status to narcotic agent status: Secondary | ICD-10-CM | POA: Insufficient documentation

## 2017-09-13 DIAGNOSIS — N6089 Other benign mammary dysplasias of unspecified breast: Secondary | ICD-10-CM | POA: Diagnosis not present

## 2017-09-13 DIAGNOSIS — N6021 Fibroadenosis of right breast: Secondary | ICD-10-CM | POA: Insufficient documentation

## 2017-09-13 DIAGNOSIS — N6489 Other specified disorders of breast: Secondary | ICD-10-CM | POA: Diagnosis not present

## 2017-09-13 HISTORY — PX: BREAST LUMPECTOMY WITH RADIOACTIVE SEED LOCALIZATION: SHX6424

## 2017-09-13 SURGERY — BREAST LUMPECTOMY WITH RADIOACTIVE SEED LOCALIZATION
Anesthesia: General | Site: Breast | Laterality: Right

## 2017-09-13 MED ORDER — BUPIVACAINE HCL (PF) 0.25 % IJ SOLN
INTRAMUSCULAR | Status: DC | PRN
  Administered 2017-09-13: 12 mL

## 2017-09-13 MED ORDER — ENSURE PRE-SURGERY PO LIQD: 296.0000 mL | Freq: Once | ORAL | Status: DC

## 2017-09-13 MED ORDER — FENTANYL CITRATE (PF) 100 MCG/2ML IJ SOLN
INTRAMUSCULAR | Status: AC
  Filled 2017-09-13: qty 2

## 2017-09-13 MED ORDER — CEFAZOLIN SODIUM-DEXTROSE 2-4 GM/100ML-% IV SOLN: 2.0000 g | INTRAVENOUS | Status: DC

## 2017-09-13 MED ORDER — PROPOFOL 10 MG/ML IV BOLUS
INTRAVENOUS | Status: AC
  Filled 2017-09-13: qty 40

## 2017-09-13 MED ORDER — ACETAMINOPHEN 500 MG PO TABS
1000.0000 mg | ORAL_TABLET | ORAL | Status: AC
  Administered 2017-09-13: 1000 mg via ORAL

## 2017-09-13 MED ORDER — GABAPENTIN 100 MG PO CAPS
ORAL_CAPSULE | ORAL | Status: AC
  Filled 2017-09-13: qty 1

## 2017-09-13 MED ORDER — LACTATED RINGERS IV SOLN
INTRAVENOUS | Status: DC
  Administered 2017-09-13: 12:00:00 via INTRAVENOUS

## 2017-09-13 MED ORDER — LIDOCAINE HCL (CARDIAC) 20 MG/ML IV SOLN
INTRAVENOUS | Status: AC
  Filled 2017-09-13: qty 5

## 2017-09-13 MED ORDER — ONDANSETRON HCL 4 MG/2ML IJ SOLN
INTRAMUSCULAR | Status: DC | PRN
  Administered 2017-09-13: 4 mg via INTRAVENOUS

## 2017-09-13 MED ORDER — DEXAMETHASONE SODIUM PHOSPHATE 10 MG/ML IJ SOLN
INTRAMUSCULAR | Status: AC
  Filled 2017-09-13: qty 1

## 2017-09-13 MED ORDER — TRAMADOL HCL 50 MG PO TABS: 50.0000 mg | ORAL_TABLET | Freq: Four times a day (QID) | ORAL | 0 refills | Status: DC | PRN

## 2017-09-13 MED ORDER — ONDANSETRON HCL 4 MG/2ML IJ SOLN: 4.0000 mg | Freq: Once | INTRAMUSCULAR | Status: DC | PRN

## 2017-09-13 MED ORDER — ONDANSETRON HCL 4 MG/2ML IJ SOLN
INTRAMUSCULAR | Status: AC
  Filled 2017-09-13: qty 2

## 2017-09-13 MED ORDER — CEFAZOLIN SODIUM-DEXTROSE 2-4 GM/100ML-% IV SOLN
INTRAVENOUS | Status: AC
  Filled 2017-09-13: qty 100

## 2017-09-13 MED ORDER — FENTANYL CITRATE (PF) 100 MCG/2ML IJ SOLN
25.0000 ug | INTRAMUSCULAR | Status: DC | PRN
  Administered 2017-09-13 (×2): 25 ug via INTRAVENOUS

## 2017-09-13 MED ORDER — ACETAMINOPHEN 500 MG PO TABS
ORAL_TABLET | ORAL | Status: AC
  Filled 2017-09-13: qty 1

## 2017-09-13 MED ORDER — SCOPOLAMINE 1 MG/3DAYS TD PT72: 1.0000 | MEDICATED_PATCH | Freq: Once | TRANSDERMAL | Status: DC | PRN

## 2017-09-13 MED ORDER — EPHEDRINE SULFATE 50 MG/ML IJ SOLN
INTRAMUSCULAR | Status: DC | PRN
  Administered 2017-09-13: 10 mg via INTRAVENOUS
  Administered 2017-09-13: 5 mg via INTRAVENOUS

## 2017-09-13 MED ORDER — LIDOCAINE HCL (CARDIAC) 20 MG/ML IV SOLN
INTRAVENOUS | Status: DC | PRN
  Administered 2017-09-13: 50 mg via INTRAVENOUS

## 2017-09-13 MED ORDER — CEFAZOLIN SODIUM-DEXTROSE 2-3 GM-%(50ML) IV SOLR
INTRAVENOUS | Status: DC | PRN
  Administered 2017-09-13: 2 g via INTRAVENOUS

## 2017-09-13 MED ORDER — MIDAZOLAM HCL 2 MG/2ML IJ SOLN: 1.0000 mg | INTRAMUSCULAR | Status: DC | PRN

## 2017-09-13 MED ORDER — PROPOFOL 10 MG/ML IV BOLUS
INTRAVENOUS | Status: DC | PRN
  Administered 2017-09-13: 100 mg via INTRAVENOUS

## 2017-09-13 MED ORDER — FENTANYL CITRATE (PF) 100 MCG/2ML IJ SOLN
50.0000 ug | INTRAMUSCULAR | Status: DC | PRN
  Administered 2017-09-13: 50 ug via INTRAVENOUS

## 2017-09-13 MED ORDER — DEXAMETHASONE SODIUM PHOSPHATE 10 MG/ML IJ SOLN
INTRAMUSCULAR | Status: DC | PRN
  Administered 2017-09-13: 10 mg via INTRAVENOUS

## 2017-09-13 MED ORDER — GABAPENTIN 100 MG PO CAPS
100.0000 mg | ORAL_CAPSULE | ORAL | Status: AC
  Administered 2017-09-13: 100 mg via ORAL

## 2017-09-13 MED FILL — traMADol HCL 50 MG TABS: 50 | 2 days supply | Qty: 10 | Fill #0

## 2017-09-13 SURGICAL SUPPLY — 57 items
ADH SKN CLS APL DERMABOND .7 (GAUZE/BANDAGES/DRESSINGS) ×1
APPLIER CLIP 9.375 MED OPEN (MISCELLANEOUS)
APR CLP MED 9.3 20 MLT OPN (MISCELLANEOUS)
BINDER BREAST LRG (GAUZE/BANDAGES/DRESSINGS) IMPLANT
BINDER BREAST XLRG (GAUZE/BANDAGES/DRESSINGS) IMPLANT
BLADE SURG 15 STRL LF DISP TIS (BLADE) ×1 IMPLANT
BLADE SURG 15 STRL SS (BLADE) ×3
CANISTER SUC SOCK COL 7IN (MISCELLANEOUS) IMPLANT
CANISTER SUCT 1200ML W/VALVE (MISCELLANEOUS) IMPLANT
CHLORAPREP W/TINT 26ML (MISCELLANEOUS) ×3 IMPLANT
CLIP APPLIE 9.375 MED OPEN (MISCELLANEOUS) IMPLANT
CLIP VESOCCLUDE SM WIDE 6/CT (CLIP) ×2 IMPLANT
CLOSURE WOUND 1/2 X4 (GAUZE/BANDAGES/DRESSINGS) ×1
COVER BACK TABLE 60X90IN (DRAPES) ×3 IMPLANT
COVER MAYO STAND STRL (DRAPES) ×3 IMPLANT
COVER PROBE W GEL 5X96 (DRAPES) ×3 IMPLANT
DECANTER SPIKE VIAL GLASS SM (MISCELLANEOUS) IMPLANT
DERMABOND ADVANCED (GAUZE/BANDAGES/DRESSINGS) ×2
DERMABOND ADVANCED .7 DNX12 (GAUZE/BANDAGES/DRESSINGS) ×1 IMPLANT
DEVICE DUBIN W/COMP PLATE 8390 (MISCELLANEOUS) ×3 IMPLANT
DRAPE LAPAROSCOPIC ABDOMINAL (DRAPES) ×3 IMPLANT
DRAPE UTILITY XL STRL (DRAPES) ×3 IMPLANT
ELECT COATED BLADE 2.86 ST (ELECTRODE) ×3 IMPLANT
ELECT REM PT RETURN 9FT ADLT (ELECTROSURGICAL) ×3
ELECTRODE REM PT RTRN 9FT ADLT (ELECTROSURGICAL) ×1 IMPLANT
GAUZE SPONGE 4X4 12PLY STRL LF (GAUZE/BANDAGES/DRESSINGS) IMPLANT
GLOVE BIO SURGEON STRL SZ7 (GLOVE) ×6 IMPLANT
GLOVE BIOGEL PI IND STRL 7.5 (GLOVE) ×1 IMPLANT
GLOVE BIOGEL PI INDICATOR 7.5 (GLOVE) ×2
GOWN STRL REUS W/ TWL LRG LVL3 (GOWN DISPOSABLE) ×2 IMPLANT
GOWN STRL REUS W/TWL LRG LVL3 (GOWN DISPOSABLE) ×6
HEMOSTAT ARISTA ABSORB 3G PWDR (MISCELLANEOUS) IMPLANT
ILLUMINATOR WAVEGUIDE N/F (MISCELLANEOUS) IMPLANT
KIT MARKER MARGIN INK (KITS) ×3 IMPLANT
LIGHT WAVEGUIDE WIDE FLAT (MISCELLANEOUS) IMPLANT
NDL HYPO 25X1 1.5 SAFETY (NEEDLE) ×1 IMPLANT
NEEDLE HYPO 25X1 1.5 SAFETY (NEEDLE) ×3 IMPLANT
NS IRRIG 1000ML POUR BTL (IV SOLUTION) ×2 IMPLANT
PACK BASIN DAY SURGERY FS (CUSTOM PROCEDURE TRAY) ×3 IMPLANT
PENCIL BUTTON HOLSTER BLD 10FT (ELECTRODE) ×3 IMPLANT
SLEEVE SCD COMPRESS KNEE MED (MISCELLANEOUS) ×3 IMPLANT
SPONGE LAP 4X18 X RAY DECT (DISPOSABLE) ×3 IMPLANT
STRIP CLOSURE SKIN 1/2X4 (GAUZE/BANDAGES/DRESSINGS) ×2 IMPLANT
SUT MNCRL AB 4-0 PS2 18 (SUTURE) ×3 IMPLANT
SUT MON AB 5-0 PS2 18 (SUTURE) IMPLANT
SUT SILK 2 0 SH (SUTURE) ×2 IMPLANT
SUT VIC AB 2-0 SH 27 (SUTURE) ×3
SUT VIC AB 2-0 SH 27XBRD (SUTURE) ×1 IMPLANT
SUT VIC AB 3-0 SH 27 (SUTURE) ×3
SUT VIC AB 3-0 SH 27X BRD (SUTURE) ×1 IMPLANT
SUT VIC AB 5-0 PS2 18 (SUTURE) IMPLANT
SYR CONTROL 10ML LL (SYRINGE) ×3 IMPLANT
TOWEL OR 17X24 6PK STRL BLUE (TOWEL DISPOSABLE) ×3 IMPLANT
TOWEL OR NON WOVEN STRL DISP B (DISPOSABLE) ×3 IMPLANT
TUBE CONNECTING 20'X1/4 (TUBING) ×1
TUBE CONNECTING 20X1/4 (TUBING) ×1 IMPLANT
YANKAUER SUCT BULB TIP NO VENT (SUCTIONS) ×2 IMPLANT

## 2017-09-13 NOTE — Anesthesia Preprocedure Evaluation (Signed)
Anesthesia Evaluation  Patient identified by MRN, date of birth, ID band Patient awake    Reviewed: Allergy & Precautions, NPO status , Patient's Chart, lab work & pertinent test results  Airway Mallampati: II  TM Distance: >3 FB Neck ROM: Full    Dental  (+) Teeth Intact, Dental Advisory Given   Pulmonary    breath sounds clear to auscultation       Cardiovascular hypertension,  Rhythm:Regular Rate:Normal     Neuro/Psych    GI/Hepatic   Endo/Other    Renal/GU      Musculoskeletal   Abdominal   Peds  Hematology   Anesthesia Other Findings   Reproductive/Obstetrics                            Anesthesia Physical Anesthesia Plan  ASA: II  Anesthesia Plan: General   Post-op Pain Management:    Induction: Intravenous  PONV Risk Score and Plan: Ondansetron and Dexamethasone  Airway Management Planned: LMA  Additional Equipment:   Intra-op Plan:   Post-operative Plan: Extubation in OR  Informed Consent: I have reviewed the patients History and Physical, chart, labs and discussed the procedure including the risks, benefits and alternatives for the proposed anesthesia with the patient or authorized representative who has indicated his/her understanding and acceptance.     Dental advisory given  Plan Discussed with: CRNA and Anesthesiologist  Anesthesia Plan Comments:         Anesthesia Quick Evaluation  

## 2017-09-13 NOTE — Interval H&P Note (Signed)
History and Physical Interval Note:  09/13/2017 12:04 PM  Stephanie Liu  has presented today for surgery, with the diagnosis of RIGHT BREAST CANCER, RIGHT BREAST MASS  The various methods of treatment have been discussed with the patient and family. After consideration of risks, benefits and other options for treatment, the patient has consented to  Procedure(s): RIGHT BREAST LUMPECTOMY WITH RADIOACTIVE SEED LOCALIZATION, AND RIGHT BREAST SEED GUIDED  EXCISIONAL BIOPSY (Right) as a surgical intervention .  The patient's history has been reviewed, patient examined, no change in status, stable for surgery.  I have reviewed the patient's chart and labs.  Questions were answered to the patient's satisfaction.     Rolm Bookbinder

## 2017-09-13 NOTE — Anesthesia Postprocedure Evaluation (Signed)
Anesthesia Post Note  Patient: Stephanie Liu  Procedure(s) Performed: RIGHT BREAST LUMPECTOMY WITH RADIOACTIVE SEED LOCALIZATION, AND RIGHT BREAST SEED GUIDED  EXCISIONAL BIOPSY (Right Breast)     Patient location during evaluation: PACU Anesthesia Type: General Level of consciousness: awake and alert Pain management: pain level controlled Vital Signs Assessment: post-procedure vital signs reviewed and stable Respiratory status: spontaneous breathing, nonlabored ventilation and respiratory function stable Cardiovascular status: blood pressure returned to baseline and stable Postop Assessment: no apparent nausea or vomiting Anesthetic complications: no    Last Vitals:  Vitals:   09/13/17 1345 09/13/17 1400  BP: (!) 142/62 (!) 150/69  Pulse: 73 74  Resp: 12 11  Temp:    SpO2: 96% 96%    Last Pain:  Vitals:   09/13/17 1400  TempSrc:   PainSc: 4                  Catalina Gravel

## 2017-09-13 NOTE — Anesthesia Procedure Notes (Signed)
Procedure Name: LMA Insertion Performed by: Romey Cohea M, CRNA Pre-anesthesia Checklist: Patient identified, Emergency Drugs available, Suction available, Patient being monitored and Timeout performed Patient Re-evaluated:Patient Re-evaluated prior to induction Oxygen Delivery Method: Circle system utilized Preoxygenation: Pre-oxygenation with 100% oxygen Induction Type: IV induction LMA: LMA inserted LMA Size: 4.0 Tube type: Oral Number of attempts: 1 Placement Confirmation: positive ETCO2,  CO2 detector and breath sounds checked- equal and bilateral Tube secured with: Tape Dental Injury: Teeth and Oropharynx as per pre-operative assessment        

## 2017-09-13 NOTE — Op Note (Signed)
Preoperative diagnosis: 1.  Right breast cancer, clinical stage I 2.  Right breast complex sclerosing lesion Postoperative diagnosis: Same as above Procedure: 1.  Right breast seed guided lumpectomy 2.  Right breast seed guided excisional biopsy Surgeon: Dr. Serita Grammes Anesthesia: General Specimens: 1.  Right breast tissue, cancer with seed separate 2.  Right breast tissue with additional posterior tissue at site of complex sclerosing lesion biopsy Complications: Neither clip was identified on the excisions. Drains: None Sponge needle count was correct x2 at completion Disposition to recovery in stable condition  Indications: This is a 77 year old female who presents with an abnormal mammogram.  She had a right breast mass that underwent biopsy that is a small estrogen receptor positive tumor.  There was some question on her mammogram so she underwent an MRI.  MRI identified a different lesion that was a complex sclerosing lesion on core biopsy.  We discussed all of her options in a multidisciplinary fashion and elected to proceed with a lumpectomy for the cancer, excisional biopsy for the MRI found lesion.  We have elected due to the fact that this is a grade 1 small ER positive tumor to omit a sentinel node in this 77 year old.  She had 2 seeds placed prior to beginning.  I had these mammograms available in the operating room.  Procedure: After informed consent was obtained the patient was taken to the operating room.  She was given antibiotics.  SCDs were in place.  She was placed under general anesthesia without complication.  She was prepped and draped in the standard sterile surgical fashion.  A surgical timeout was then performed.  I identified both seeds that were in the right breast.  The seed in the cancer appeared to be very close to the skin.  This appeared that way on mammography as well as by the counts.  I elected to make a periareolar incision after infiltrating Marcaine to  hide the scar.  I then tunneled towards the cancer seed.  This was really at the skin level.  I dissected directly under the dermis and noted the radioactive seed was just sitting underneath the skin not really present in the tissue at all.  I removed this separately.  I then proceeded to remove this entire area where the cancer had been.  There was a hematoma that I entered.  I then took a mammogram picture of this that did not have the clip in it.  I did confirm with radiology that the mass that was present was directly in the center of this area.  I am going to assume that the clip was in the hematoma that I decompressed when I did this.  I obtained hemostasis in this.  I then looked towards the other lesion through the same incision.  It did not appear that it was very far.  I then used cautery to dissect down to where the seed was.  Due to the fact that this lesion was benign I stayed very close to the seed as I got close to it.  I then remove the seed in the surrounding tissue that was connected to my other cavity.  When I did a mammogram picture of this the clip was not present.  I think this is because the seed was just a little bit away from the clip.  There was no palpable abnormality that I can identify.  I did remove some more tissue posterior to this to see if the clip was in it but  it was not either.  There was no more tissue really present that I felt that I could removed to identify where this clip was.  I then elected to obtain hemostasis and closed the breast tissue with 2-0 Vicryl.  The dermis was closed with 3-0 Vicryl and the skin with 5-0 Monocryl.  I then applied Steri-Strips and glue.  I will plan on obtaining a mammogram in about a month just to see if that last remaining clip is there and await the pathology.  I explained all this to her husband as well.  She tolerated this very well and was transferred to recovery stable.

## 2017-09-13 NOTE — Discharge Instructions (Signed)
[facility] Office Phone Number [phone]   POST OP INSTRUCTIONS  Always review your discharge instruction sheet given to you by the facility where your surgery was performed.  IF YOU HAVE DISABILITY OR FAMILY LEAVE FORMS, YOU MUST BRING THEM TO THE OFFICE FOR PROCESSING.  DO NOT GIVE THEM TO YOUR DOCTOR.  1. A prescription for pain medication may be given to you upon discharge.  Take your pain medication as prescribed, if needed.  If narcotic pain medicine is not needed, then you may take acetaminophen (Tylenol), naprosyn (Alleve) or ibuprofen (Advil) as needed. No Tylenol until 6pm 2. Take your usually prescribed medications unless otherwise directed 3. If you need a refill on your pain medication, please contact your pharmacy.  They will contact our office to request authorization.  Prescriptions will not be filled after 5pm or on week-ends. 4. You should eat very light the first 24 hours after surgery, such as soup, crackers, pudding, etc.  Resume your normal diet the day after surgery. 5. Most patients will experience some swelling and bruising in the breast.  Ice packs and a good support bra will help.  Wear the breast binder provided or a sports bra for 72 hours day and night.  After that wear a sports bra during the day until you return to the office. Swelling and bruising can take several days to resolve.  6. It is common to experience some constipation if taking pain medication after surgery.  Increasing fluid intake and taking a stool softener will usually help or prevent this problem from occurring.  A mild laxative (Milk of Magnesia or Miralax) should be taken according to package directions if there are no bowel movements after 48 hours. 7. Unless discharge instructions indicate otherwise, you may remove your bandages 48 hours after surgery and you may shower at that time.  You may have steri-strips (small skin tapes) in place directly over the incision.  These strips  should be left on the skin for 7-10 days and will come off on their own.  If your surgeon used skin glue on the incision, you may shower in 24 hours.  The glue will flake off over the next 2-3 weeks.  Any sutures or staples will be removed at the office during your follow-up visit. 8. ACTIVITIES:  You may resume regular daily activities (gradually increasing) beginning the next day.  Wearing a good support bra or sports bra minimizes pain and swelling.  You may have sexual intercourse when it is comfortable. a. You may drive when you no longer are taking prescription pain medication, you can comfortably wear a seatbelt, and you can safely maneuver your car and apply brakes. b. RETURN TO WORK:  ______________________________________________________________________________________ 9. You should see your doctor in the office for a follow-up appointment approximately two weeks after your surgery.  Your doctors nurse will typically make your follow-up appointment when she calls you with your pathology report.  Expect your pathology report 3-4 business days after your surgery.  You may call to check if you do not hear from [place] after three days. 10. OTHER INSTRUCTIONS: _______________________________________________________________________________________________ _____________________________________________________________________________________________________________________________________ _____________________________________________________________________________________________________________________________________ _____________________________________________________________________________________________________________________________________  WHEN TO CALL DR WAKEFIELD: 1. Fever over 101.0 2. Nausea and/or vomiting. 3. Extreme swelling or bruising. 4. Continued bleeding from incision. 5. Increased pain, redness, or drainage from the incision.  The clinic staff is available to answer your  questions during regular business hours.  Please dont hesitate to call and ask to speak to one of the nurses  for clinical concerns.  If you have a medical emergency, go to the nearest emergency room or call 911.  A surgeon from [facility] is always on call at the hospital.  For further questions, please visit [email or web address]    Post Anesthesia Home Care Instructions  Activity: Get plenty of rest for the remainder of the day. A responsible individual must stay with you for 24 hours following the procedure.  For the next 24 hours, DO NOT: -Drive a car -Paediatric nurse -Drink alcoholic beverages -Take any medication unless instructed by your physician -Make any legal decisions or sign important papers.  Meals: Start with liquid foods such as gelatin or soup. Progress to regular foods as tolerated. Avoid greasy, spicy, heavy foods. If nausea and/or vomiting occur, drink only clear liquids until the nausea and/or vomiting subsides. Call your physician if vomiting continues.  Special Instructions/Symptoms: Your throat may feel dry or sore from the anesthesia or the breathing tube placed in your throat during surgery. If this causes discomfort, gargle with warm salt water. The discomfort should disappear within 24 hours.  If you had a scopolamine patch placed behind your ear for the management of post- operative nausea and/or vomiting:  1. The medication in the patch is effective for 72 hours, after which it should be removed.  Wrap patch in a tissue and discard in the trash. Wash hands thoroughly with soap and water. 2. You may remove the patch earlier than 72 hours if you experience unpleasant side effects which may include dry mouth, dizziness or visual disturbances. 3. Avoid touching the patch. Wash your hands with soap and water after contact with the patch.

## 2017-09-13 NOTE — Transfer of Care (Signed)
Immediate Anesthesia Transfer of Care Note  Patient: Stephanie Liu  Procedure(s) Performed: RIGHT BREAST LUMPECTOMY WITH RADIOACTIVE SEED LOCALIZATION, AND RIGHT BREAST SEED GUIDED  EXCISIONAL BIOPSY (Right Breast)  Patient Location: PACU  Anesthesia Type:General  Level of Consciousness: awake  Airway & Oxygen Therapy: Patient Spontanous Breathing and Patient connected to face mask oxygen  Post-op Assessment: Report given to RN and Post -op Vital signs reviewed and stable  Post vital signs: Reviewed and stable  Last Vitals:  Vitals Value Taken Time  BP    Temp    Pulse 75 09/13/2017  1:26 PM  Resp    SpO2 100 % 09/13/2017  1:26 PM  Vitals shown include unvalidated device data.  Last Pain:  Vitals:   09/13/17 1127  TempSrc: Oral  PainSc: 0-No pain         Complications: No apparent anesthesia complications

## 2017-09-13 NOTE — Brief Op Note (Signed)
09/13/2017  1:17 PM  PATIENT:  Stephanie Liu  77 y.o. female  PRE-OPERATIVE DIAGNOSIS:  RIGHT BREAST CANCER, RIGHT BREAST MASS  POST-OPERATIVE DIAGNOSIS:  RIGHT BREAST CANCER, RIGHT BREAST MASS  PROCEDURE:  Procedure(s): RIGHT BREAST LUMPECTOMY WITH RADIOACTIVE SEED LOCALIZATION, AND RIGHT BREAST SEED GUIDED  EXCISIONAL BIOPSY (Right)  SURGEON:  Surgeon(s) and Role:    Rolm Bookbinder, MD - Primary  PHYSICIAN ASSISTANT:   ASSISTANTS: none   ANESTHESIA:   general  EBL: minimal  BLOOD ADMINISTERED:none  DRAINS: none   LOCAL MEDICATIONS USED:  MARCAINE     SPECIMEN:  Lumpectomy  DISPOSITION OF SPECIMEN:  PATHOLOGY  COUNTS:  YES  TOURNIQUET:  * No tourniquets in log *  DICTATION: .Dragon Dictation  PLAN OF CARE: Discharge to home after PACU  PATIENT DISPOSITION:  PACU - hemodynamically stable.   Delay start of Pharmacological VTE agent (>24hrs) due to surgical blood loss or risk of bleeding: not applicable

## 2017-09-13 NOTE — H&P (Signed)
50 yof referred by Dr Joylene Draft for new right breast cancer. she has no personal history and no family history of breast cancer. she has no mass or dc. she has been treated recently for collagenous colitis by Dr Cristina Gong. she has density b breasts on mm. there is a 1.3 cm irregular mass in left breast uoq and a 7 mm node in uoq also. US shows a 1.3 cm mass but not the other area. the mass underwent biopsy and is a grade I IDC that is er/pr pos, her 2 negative, and Ki is 5%. the other area is recommended for biopsy and then apparently was unable to be. I sent her for mri. this shows b density breasts. left breast is normal. nodes are all normal. the right breast has the known cancer that is in anterior third of the uoq measuring 7x9x52mm. there is also nme in lower outer right breast measuring 5x8x38mm. biopsy of the second mr found lesion is a csl. she is here with her husband Dr Warren Danes to discuss options  Past Surgical History (Tanisha A. Owens Shark, [place]; 08/09/2017 10:38 AM) Breast Biopsy  Right. Cataract Surgery  Bilateral. Gallbladder Surgery - Open  Hysterectomy (not due to cancer) - Complete  Shoulder Surgery  Right. Tonsillectomy   Diagnostic Studies History (Tanisha A. Owens Shark, [place]; 08/09/2017 10:38 AM) Colonoscopy  within last year Mammogram  within last year Pap Smear  1-5 years ago  Allergies (Tanisha A. Owens Shark, [place]; 08/09/2017 10:39 AM) Demerol *ANALGESICS - OPIOID*  Allergies Reconciled   Medication History (Tanisha A. Owens Shark, [place]; 08/09/2017 10:42 AM) Entocort EC (3MG  Capsule ER 32IZ, Oral) Active. Aspirin (81MG  Tablet, Oral) Active. Biotin (5000MCG Tablet, Oral) Active. Bystolic (5MG  Tablet, Oral) Active. CoQ-10 (400MG  Capsule, Oral) Active. Crestor (5MG  Tablet, Oral) Active. Fish Oil (1200MG  Capsule, Oral) Active. Multi-Vitamin (Oral) Active. PreserVision AREDS (Oral) Active. Triamterene-HCTZ (37.5-25MG  Tablet, Oral) Active. Zetia (10MG  Tablet, Oral)  Active. Medications Reconciled  Social History (Tanisha A. Owens Shark, [place]; 08/09/2017 10:38 AM) Alcohol use  Occasional alcohol use. Caffeine use  Coffee, Tea. No drug use  Tobacco use  Never smoker.  Family History (Tanisha A. Owens Shark, [place]; 08/09/2017 10:38 AM) Alcohol Abuse  Brother. Depression  Mother. Diabetes Mellitus  Brother, Sister. Heart Disease  Brother, Sister. Hypertension  Brother, Mother, Sister, Son. Melanoma  Sister. Prostate Cancer  Father.  Pregnancy / Birth History (Tanisha A. Owens Shark, RMA; 08/09/2017 10:38 AM) Age at menarche  77 years. Age of menopause  56-50 Contraceptive History  Oral contraceptives. Gravida  4 Length (months) of breastfeeding  3-6 Maternal age  60-30 Para  2  Other Problems (Tanisha A. Owens Shark, [place]; 08/09/2017 10:38 AM) Breast Cancer  Cholelithiasis  High blood pressure  Hypercholesterolemia  Lump In Breast  Oophorectomy     Review of Systems (Tanisha A. Brown RMA; 08/09/2017 10:38 AM) General Not Present- Appetite Loss, Chills, Fatigue, Fever, Night Sweats, Weight Gain and Weight Loss. Skin Not Present- Change in Wart/Mole, Dryness, Hives, Jaundice, New Lesions, Non-Healing Wounds, Rash and Ulcer. HEENT Present- Seasonal Allergies and Wears glasses/contact lenses. Not Present- Earache, Hearing Loss, Hoarseness, Nose Bleed, Oral Ulcers, Ringing in the Ears, Sinus Pain, Sore Throat, Visual Disturbances and Yellow Eyes. Respiratory Not Present- Bloody sputum, Chronic Cough, Difficulty Breathing, Snoring and Wheezing. Breast Present- Breast Mass. Not Present- Breast Pain, Nipple Discharge and Skin Changes. Cardiovascular Not Present- Chest Pain, Difficulty Breathing Lying Down, Leg Cramps, Palpitations, Rapid Heart Rate, Shortness of Breath and Swelling of Extremities. Gastrointestinal Present- Change in Bowel Habits.  Not Present- Abdominal Pain, Bloating, Bloody Stool, Chronic diarrhea, Constipation, Difficulty Swallowing,  Excessive gas, Gets full quickly at meals, Hemorrhoids, Indigestion, Nausea, Rectal Pain and Vomiting. Female Genitourinary Not Present- Frequency, Nocturia, Painful Urination, Pelvic Pain and Urgency. Musculoskeletal Present- Joint Stiffness. Not Present- Back Pain, Joint Pain, Muscle Pain, Muscle Weakness and Swelling of Extremities. Neurological Not Present- Decreased Memory, Fainting, Headaches, Numbness, Seizures, Tingling, Tremor, Trouble walking and Weakness. Endocrine Not Present- Cold Intolerance, Excessive Hunger, Hair Changes, Heat Intolerance, Hot flashes and New Diabetes. Hematology Not Present- Blood Thinners, Easy Bruising, Excessive bleeding, Gland problems, HIV and Persistent Infections.  Vitals (Tanisha A. Brown RMA; 08/09/2017 10:39 AM) 08/09/2017 10:39 AM Weight: 156 lb Height: 63in Body Surface Area: 1.74 m Body Mass Index: 27.63 kg/m  Temp.: 97.53F  Pulse: 67 (Regular)  BP: 128/84 (Sitting, Left Arm, Standard) cv rrr Lungs clear No breast mass, no adenopathy  BREAST CANCER OF UPPER-OUTER QUADRANT OF RIGHT FEMALE BREAST (C50.411) Story: Right breast seed guided lumpectomy and right breast seed guided excisional biopsy I think still after conversation with oncology that omitting sn biopsy is reasonable for an 11 mm grade I er/pr pos breast cancer in accordance with latest literature on sn biopsy over 70. We discussed the options for treatment of the breast cancer which included lumpectomy versus a mastectomy. We discussed the performance of the lumpectomy with radioactive seed placement. We discussed a 5% chance of a positive margin requiring reexcision in the operating room. We also discussed that she will likely need radiation therapy if she undergoes lumpectomy. The breast cannot undergo more radiation therapy in the same breast after lumpectomy in the future. We discussed the mastectomy and the postoperative care for that as well. Mastectomy can be followed by  reconstruction. This is a more extensive surgery and requires more recovery. The decision for lumpectomy vs mastectomy has no impact on decision for chemotherapy. Most mastectomy patients will not need radiation therapy. We discussed that there is no difference in her survival whether she undergoes lumpectomy with radiation therapy or antiestrogen therapy versus a mastectomy. There is also no real difference between her recurrence in the breast. We discussed the risks of operation including bleeding, infection, possible reoperation. She understands her further therapy will be based on what her stages at the time of her operation. I will also plan on a seed guided excisional biopsy of the csl due to small chance of upgrade. will schedule soon Current Plans

## 2017-09-14 ENCOUNTER — Encounter (HOSPITAL_BASED_OUTPATIENT_CLINIC_OR_DEPARTMENT_OTHER): Payer: Self-pay | Admitting: General Surgery

## 2017-09-20 ENCOUNTER — Telehealth: Payer: Self-pay | Admitting: *Deleted

## 2017-09-20 NOTE — Telephone Encounter (Signed)
Ordered oncotype per Dr. Jana Hakim.  Faxed requisition to pathology and confirmed receipts.

## 2017-09-22 MED FILL — TRIAMTERENE-HCTZ 37.5-25 MG: 37.5-25 | 90 days supply | Qty: 45 | Fill #1

## 2017-09-27 ENCOUNTER — Other Ambulatory Visit: Payer: PPO

## 2017-09-27 ENCOUNTER — Encounter: Payer: PPO | Admitting: Genetics

## 2017-09-27 DIAGNOSIS — Z170 Estrogen receptor positive status [ER+]: Secondary | ICD-10-CM | POA: Diagnosis not present

## 2017-09-27 DIAGNOSIS — C50411 Malignant neoplasm of upper-outer quadrant of right female breast: Secondary | ICD-10-CM | POA: Diagnosis not present

## 2017-09-28 ENCOUNTER — Telehealth: Payer: Self-pay | Admitting: *Deleted

## 2017-09-28 NOTE — Telephone Encounter (Signed)
Received oncotype score of 17/5%. Physician team notified. Called pt with results and discussed no need for chemotherapy.

## 2017-09-29 ENCOUNTER — Encounter (HOSPITAL_COMMUNITY): Payer: Self-pay | Admitting: Oncology

## 2017-09-29 ENCOUNTER — Other Ambulatory Visit: Payer: Self-pay | Admitting: General Surgery

## 2017-09-29 DIAGNOSIS — Z9889 Other specified postprocedural states: Secondary | ICD-10-CM

## 2017-10-07 ENCOUNTER — Encounter: Payer: Self-pay | Admitting: Radiation Oncology

## 2017-10-11 DIAGNOSIS — R928 Other abnormal and inconclusive findings on diagnostic imaging of breast: Secondary | ICD-10-CM | POA: Diagnosis not present

## 2017-10-11 NOTE — Progress Notes (Signed)
Location of Breast Cancer: Estrogen receptor positive breast cancer (right)  Histology per Pathology Report:  1. Breast, lumpectomy, right - INVASIVE DUCTAL CARCINOMA, NOTTINGHAM GRADE 2 OF 3, 0.9 CM - MARGINS UNINVOLVED BY CARCINOMA (<0.1 CM, LATERAL MARGIN) - LOBULAR NEOPLASIA (ATYPICAL LOBULAR HYPERPLASIA) - FIBROCYSTIC CHANGES INCLUDING APOCRINE METAPLASIA - PREVIOUS BIOPSY SITE CHANGES - SEE ONCOLOGY TABLE BELOW 2. Breast, lumpectomy, right - USUAL DUCTAL HYPERPLASIA - ADENOSIS, FIBROCYSTIC CHANGES AND DUCT ECTASIA - NO MALIGNANCY IDENTIFIED 3. Breast, excision, right additional posterior - ATYPICAL DUCTAL HYPERPLASIA - FIBROSIS, FIBROCYSTIC CHANGES AND DUCT ECTASIA - NO MALIGNANCY IDENTIFIE  Receptor Status: ER(95%), PR (80%), Her2-neu (Negative), Ki-(5%)  Did patient present with symptoms (if so, please note symptoms) or was this found on screening mammography?:  Reports that there were no specific symptoms leading to the original mammogram, which was routinely scheduled on 07/26/2017  Past/Anticipated interventions by surgeon, if any: 09/13/2017: RIGHT BREAST LUMPECTOMY WITH RADIOACTIVE SEED LOCALIZATION, AND RIGHT BREAST SEED GUIDED  EXCISIONAL BIOPSY (Right)  Past/Anticipated interventions by medical oncology, if any:  09/28/2017: Per Dawn Stuart--Nurse Navigator: No need for chemotherapy based on oncotype score  Lymphedema issues, if any:  None   Pain issues, if any: Tenderness/soreness from repeat mammogram yesterday 10/11/17  SAFETY ISSUES:  Prior radiation? No  Pacemaker/ICD? No  Possible current pregnancy? No  Is the patient on methotrexate? 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 [name], her husband is a retired Film/video editor. She presents today for her radiation consult. Her older sister in [place] was also recently diagnosed with left breast cancer and doesn't have as much support, so she is worried  about how she can help her manage. Otherwise, she is in good spirits and eager to findout what her treatment course will be.       Zola Button, RN 10/11/2017,3:03 PM

## 2017-10-11 NOTE — Progress Notes (Signed)
[place]  Telephone:([phone]) [phone] Fax:(336) [phone]     ID: Stephanie Liu DOB: November 19, 1940  MR#: [id number]  LZJ#:[id number]  Patient Care Team: Stephanie Infante, MD as PCP - General (Internal Medicine) Magrinat, Virgie Dad, MD as Consulting Physician (Oncology) Stephanie Lobo, MD as Consulting Physician (Gastroenterology) Stephanie Bookbinder, MD as Consulting Physician (General Surgery) Stephanie Pray, MD as Consulting Physician (Radiation Oncology) Stephanie Maudlin, MD as Consulting Physician (Orthopedic Surgery) OTHER MD:  CHIEF COMPLAINT: Estrogen receptor positive breast cancer  CURRENT TREATMENT: Adjuvant radiation pending  HISTORY OF CURRENT ILLNESS: From the original intake note:  Stephanie Liu had routine screening mammography at [facility] on 07/26/2017 showing breast density category B.  There was a possible abnormality in the right breast. She underwent right breast ultrasonography on 07/30/2017 showing: a 1.3 cm irregular solid mass in there right breast upper outer quadrant highly suggestive of malignancy. An additional mass, possibly a lymph node in the right breast upper outer quadrant posterior depth was read as intermediate suspicion for malignancy.  Accordingly on 08/02/2017 Stephanie Liu proceeded to biopsy of the 1.3 cm right breast mass in question. The pathology from this procedure showed ([id number]):  invasive ductal carcinoma, grade 1, estrogen receptor 95% positive, progesterone receptor 80% positive, with an MIB-1 of 5%, and no HER-2 amplification, the signals ratio being 1.06 and the number per cell 1.90.Marland Kitchen   To further evaluate the second area of concern in the right breast she underwent breast MRI on 08/12/2017 with results showing: Known index cancer over the anterior third of the upper-outer right breast measuring 0.7 x 0.9 x 1.1 cm. Focus of indeterminate non mass enhancement over the posterior third of the outer lower right breast measuring 0.5 x 0.8 x 1.7 cm.  The  patient's subsequent history is as detailed below.  INTERVAL HISTORY: Stephanie Liu returns today for follow up and treatment of her estrogen receptor positive breast cancer accompanied by her husband. She notes that surgery was fine and she suffered no complications form this. She had some minor pain, soreness, sensitivity, and skin thickening at the surgical excision. She denies bleeding or fever.    Since her last visit, she underwent right lumpectomy of the two masses in the upper outer quadrant ([id number]) on 09/13/2017 with pathology showing: Invasive ductal carcinoma grade II spanning 0.9 cm with negative margins. Atypical lobular hyperplasia. Fibrocystic changes including apocrine metaplasia. In the second mass, no malignancy was identified. Additional right posterior excision site showed atypical ductal hyperplasia and no malignancy identified.   She also had an Oncotype which showed a low recurrence score indicating no benefit from chemotherapy as detailed below   REVIEW OF SYSTEMS: Stephanie Liu reports that for exercise, she goes to yoga classes and she walks. She also likes to swim. She followed up with Dr. Sondra Liu and she is still trying to decide if she wants radiation. She is also planning on going on family vacation the 2nd week in July. She denies unusual headaches, visual changes, nausea, vomiting, or dizziness. There has been no unusual cough, phlegm production, or pleurisy. This been no change in bowel or bladder habits. She denies unexplained fatigue or unexplained weight loss, bleeding, rash, or fever. A detailed review of systems was otherwise stable.    PAST MEDICAL HISTORY: Past Medical History:  Diagnosis Date  . CMV infection ([place])   . Collagenous colitis   . HTN (hypertension)   . Hypercholesterolemia   . Normal nuclear stress test 03/18/2007  . Normal stress echocardiogram 02/03/2010   RBBB,  CLINICALLY NEGATIVE FOR ANGINA, NORMAL LV FUNCTION WITH MILD LVH    PAST SURGICAL  HISTORY: Past Surgical History:  Procedure Laterality Date  . APPENDECTOMY    . BREAST LUMPECTOMY WITH RADIOACTIVE SEED LOCALIZATION Right 09/13/2017   Procedure: RIGHT BREAST LUMPECTOMY WITH RADIOACTIVE SEED LOCALIZATION, AND RIGHT BREAST SEED GUIDED  EXCISIONAL BIOPSY;  Surgeon: Stephanie Bookbinder, MD;  Location: [place];  Service: General;  Laterality: Right;  . CATARACT EXTRACTION, BILATERAL    . CHOLECYSTECTOMY  1972  . ROTATOR CUFF REPAIR Right   . TOTAL ABDOMINAL HYSTERECTOMY W/ BILATERAL SALPINGOOPHORECTOMY      FAMILY HISTORY Family History  Problem Relation Age of Onset  . Breast cancer Sister 42  . Anemia Neg Hx   . Arrhythmia Neg Hx   . Asthma Neg Hx   . Clotting disorder Neg Hx   . Fainting Neg Hx   . Heart attack Neg Hx   . Heart disease Neg Hx   . Heart failure Neg Hx   . Hyperlipidemia Neg Hx   . Hypertension Neg Hx   Her father had prostate cancer at age 23. Her mother died from parkinson's and alzheimer at age 8. Her parents were immigrants from [place]. She has 2 sisters and 2 brothers. One of her brothers died last year. There is no other known family history of ovarian or breast cancer, but she has essentially no information extended family  GYNECOLOGIC HISTORY:  No LMP recorded. Patient has had a hysterectomy. 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 Stephanie Liu, her husband is a Film/video editor.  He retired in 2017.  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  Their son, Stephanie Liu, is 53 year old and is a GI Physician in [place], [place].  The patient has has 5 grandchildren       ADVANCED DIRECTIVES:    HEALTH MAINTENANCE: Social History   Tobacco Use  . Smoking status: Never Smoker  . Smokeless tobacco: Never Used  Substance Use Topics  . Alcohol use: Yes    Alcohol/week: 0.6 oz    Types: 1 Glasses of wine per week    Comment: Occasional  . Drug use: Never     Colonoscopy: UTD  PAP: n/a  Bone density: UTD with Dr. Joylene Draft   Allergies  Allergen Reactions  . Codeine Hives  . Sulfa Antibiotics Rash    Current Outpatient Medications  Medication Sig Dispense Refill  . aspirin 81 MG EC tablet Take 81 mg by mouth 3 (three) times a week. Swallow whole.    . Biotin 2500 MCG CAPS Take 5,000 mcg by mouth daily.    . budesonide (ENTOCORT EC) 3 MG 24 hr capsule Take 3 mg by mouth every morning.   5  . Coenzyme Q10 (COQ-10) 400 MG CAPS Take 400 mg by mouth daily.    Marland Kitchen ezetimibe (ZETIA) 10 MG tablet Take 10 mg by mouth daily.    . metoprolol succinate (TOPROL-XL) 25 MG 24 hr tablet Take 25 mg by mouth daily.    . Multiple Vitamins-Minerals (PRESERVISION AREDS PO) Take by mouth daily.    . Probiotic Product (  PROBIOTIC DAILY PO) Take by mouth.    . rosuvastatin (CRESTOR) 5 MG tablet Take 2.5 mg by mouth daily.     Marland Kitchen triamterene-hydrochlorothiazide (DYAZIDE) 37.5-25 MG capsule Take 0.5 capsules by mouth daily.     No current facility-administered medications for this visit.     OBJECTIVE: Middle-aged white woman in no acute distress  Vitals:   10/12/17 1113  BP: (!) 158/68  Pulse: 64  Resp: 18  Temp: 97.7 F (36.5 C)  SpO2: 98%     Body mass index is 26.74 kg/m.   Wt Readings from Last 3 Encounters:  10/12/17 155 lb 12.8 oz (70.7 kg)  10/12/17 156 lb 2 oz (70.8 kg)  09/13/17 152 lb (68.9 kg)      ECOG FS:0 - Asymptomatic  Sclerae unicteric, EOMs intact Oropharynx clear and moist No cervical or supraclavicular adenopathy Lungs no rales or rhonchi Heart regular rate and rhythm Abd soft, nontender, positive bowel sounds MSK no focal  spinal tenderness, no upper extremity lymphedema Neuro: nonfocal, well oriented, appropriate affect Breasts: The right breast is status post recent lumpectomy.  The incision is perioral areolar and cosmetically excellent.  There is no evidence of residual or recurrent disease.  The left breast is unremarkable.  Both axilla are benign  LAB RESULTS:  CMP     Component Value Date/Time   NA 136 08/16/2017 1545   K 4.8 08/16/2017 1545   CL 99 08/16/2017 1545   CO2 30 (H) 08/16/2017 1545   GLUCOSE 98 08/16/2017 1545   BUN 18 08/16/2017 1545   CREATININE 0.84 08/16/2017 1545   CALCIUM 9.8 08/16/2017 1545   PROT 7.4 08/16/2017 1545   ALBUMIN 3.9 08/16/2017 1545   AST 29 08/16/2017 1545   ALT 38 08/16/2017 1545   ALKPHOS 57 08/16/2017 1545   BILITOT 0.3 08/16/2017 1545   GFRNONAA >60 08/16/2017 1545   GFRAA >60 08/16/2017 1545    No results found for: TOTALPROTELP, ALBUMINELP, A1GS, A2GS, BETS, BETA2SER, GAMS, MSPIKE, SPEI  No results found for: KPAFRELGTCHN, LAMBDASER, KAPLAMBRATIO  Lab Results  Component Value Date   WBC 9.0 08/16/2017   NEUTROABS 6.0 08/16/2017   HGB 15.2 08/16/2017   HCT 45.1 08/16/2017   MCV 92.8 08/16/2017   PLT 225 08/16/2017    _0 @  No results found for: LABCA2  No components found for: [id number]  No results for input(s): INR in the last 168 hours.  No results found for: LABCA2  No results found for: ZCH885  No results found for: OYD741  No results found for: OIN867  No results found for: CA2729  No components found for: HGQUANT  No results found for: CEA1 / No results found for: CEA1   No results found for: AFPTUMOR  No results found for: CHROMOGRNA  No results found for: PSA1  No visits with results within 3 Day(s) from this visit.  Latest known visit with results is:  Appointment on 08/16/2017  Component Date Value Ref Range Status  . Sodium 08/16/2017 136  136 - 145 mmol/L Final  . Potassium 08/16/2017 4.8  3.5  - 5.1 mmol/L Final  . Chloride 08/16/2017 99  98 - 109 mmol/L Final  . CO2 08/16/2017 30* 22 - 29 mmol/L Final  . Glucose, Bld 08/16/2017 98  70 - 140 mg/dL Final  . BUN 08/16/2017 18  7 - 26 mg/dL Final  . Creatinine 08/16/2017 0.84  0.60 - 1.10 mg/dL Final  . Calcium 08/16/2017 9.8  8.4 - 10.4 mg/dL  Final  . Total Protein 08/16/2017 7.4  6.4 - 8.3 g/dL Final  . Albumin 08/16/2017 3.9  3.5 - 5.0 g/dL Final  . AST 08/16/2017 29  5 - 34 U/L Final  . ALT 08/16/2017 38  0 - 55 U/L Final  . Alkaline Phosphatase 08/16/2017 57  40 - 150 U/L Final  . Total Bilirubin 08/16/2017 0.3  0.2 - 1.2 mg/dL Final  . GFR, Est Non Af Am 08/16/2017 >60  >60 mL/min Final  . GFR, Est AFR Am 08/16/2017 >60  >60 mL/min Final   Comment: (NOTE) The eGFR has been calculated using the CKD EPI equation. This calculation has not been validated in all clinical situations. eGFR's persistently <60 mL/min signify possible Chronic Kidney Disease.   Georgiann Hahn gap 08/16/2017 7  3 - 11 Final   Performed at [facility], [place] [address] [place], [place] [address]  . WBC Count 08/16/2017 9.0  3.9 - 10.3 K/uL Final  . RBC 08/16/2017 4.86  3.70 - 5.45 MIL/uL Final  . Hemoglobin 08/16/2017 15.2  11.6 - 15.9 g/dL Final  . HCT 08/16/2017 45.1  34.8 - 46.6 % Final  . MCV 08/16/2017 92.8  79.5 - 101.0 fL Final  . MCH 08/16/2017 31.3  25.1 - 34.0 pg Final  . MCHC 08/16/2017 33.7  31.5 - 36.0 g/dL Final  . RDW 08/16/2017 13.1  11.2 - 14.5 % Final  . Platelet Count 08/16/2017 225  145 - 400 K/uL Final  . Neutrophils Relative % 08/16/2017 67  % Final  . Neutro Abs 08/16/2017 6.0  1.5 - 6.5 K/uL Final  . Lymphocytes Relative 08/16/2017 26  % Final  . Lymphs Abs 08/16/2017 2.4  0.9 - 3.3 K/uL Final  . Monocytes Relative 08/16/2017 6  % Final  . Monocytes Absolute 08/16/2017 0.5  0.1 - 0.9 K/uL Final  . Eosinophils Relative 08/16/2017 1  % Final  . Eosinophils Absolute 08/16/2017 0.1  0.0 - 0.5 K/uL Final   . Basophils Relative 08/16/2017 0  % Final  . Basophils Absolute 08/16/2017 0.0  0.0 - 0.1 K/uL Final   Performed at [facility], [place] [address], [place], [place] [address]    (this displays the last labs from the last 3 days)  No results found for: TOTALPROTELP, ALBUMINELP, A1GS, A2GS, BETS, BETA2SER, GAMS, MSPIKE, SPEI (this displays SPEP labs)  No results found for: KPAFRELGTCHN, LAMBDASER, KAPLAMBRATIO (kappa/lambda light chains)  No results found for: HGBA, HGBA2QUANT, HGBFQUANT, HGBSQUAN (Hemoglobinopathy evaluation)   No results found for: LDH  No results found for: IRON, TIBC, IRONPCTSAT (Iron and TIBC)  No results found for: FERRITIN  Urinalysis No results found for: COLORURINE, APPEARANCEUR, LABSPEC, PHURINE, GLUCOSEU, HGBUR, BILIRUBINUR, KETONESUR, PROTEINUR, UROBILINOGEN, NITRITE, LEUKOCYTESUR   STUDIES: No results found.  ELIGIBLE FOR AVAILABLE RESEARCH PROTOCOL: no  ASSESSMENT: 77 y.o.-year-old [place] woman status post right breast upper outer quadrant biopsy 08/02/2017 for a clinical T1c N0, stage IA invasive ductal carcinoma, grade 1, estrogen and progesterone receptor positive, with an MIB-105% and no HER-2 amplification.  (a) MRI biopsy of second area in the right breast pending  (1) status post right lumpectomy without sentinel lymph node sampling 09/13/2017 for a pT1b pNX, stage Ia invasive ductal carcinoma, with negative margins  (2) Oncotype DX score of 17 predicts a risk of outside the breast recurrence over the next 9 years of 5% if the patient's only systemic therapy is antiestrogens for 5 years.  It also predicts  no benefit from chemotherapy.  (3) adjuvant radiation pending  (4) antiestrogens to follow at the completion of local treatment  (5) genetics testing pending  PLAN: Yasmyn did fine with her surgery.  She met earlier today with Dr. Dorathy Daft and he offered her an abbreviated course of radiation.  She initially  had difficulty deciding whether or not to do it because she hates to "lose her summer" and particularly she loves to swim and she would not be able to do that.  We went over her situation which in general is excellent in terms of the risk of systemic recurrence.  I quoted her a risk of local recurrence with surgery alone in the 30% range.  If she took antiestrogens that risk would drop to about 15%.  If in addition she took radiation the risk would drop to about 6%.  A chart I think that would be approximately a 10% difference in local recurrence risk if she takes radiation and that was my recommendation.  She is agreeable to proceed to radiation and I have alerted Dr. Sondra Liu that she would like to have this done ASAP.  We then spent a considerable amount of time discussing antiestrogens.  We reviewed the difference between tamoxifen and anastrozole in detail. She understands that anastrozole and the aromatase inhibitors in general work by blocking estrogen production. Accordingly vaginal dryness, decrease in bone density, and of course hot flashes can result. The aromatase inhibitors can also negatively affect the cholesterol profile, although that is a minor effect. One out of 5 women on aromatase inhibitors we will feel "old and achy". This arthralgia/myalgia syndrome, which resembles fibromyalgia clinically, does resolve with stopping the medications. Accordingly this is not a reason to not try an aromatase inhibitor but it is a frequent reason to stop it (in other words 20% of women will not be able to tolerate these medications).  Tamoxifen on the other hand does not block estrogen production. It does not "take away a woman's estrogen". It blocks the estrogen receptor in breast cells. Like anastrozole, it can also cause hot flashes. As opposed to anastrozole, tamoxifen has many estrogen-like effects. It is technically an estrogen receptor modulator. This means that in some tissues tamoxifen works like  estrogen-- for example it helps strengthen the bones. It tends to improve the cholesterol profile. It can cause thickening of the endometrial lining, and even endometrial polyps or rarely cancer of the uterus.(The risk of uterine cancer due to tamoxifen is one additional cancer per thousand women year). It can cause vaginal wetness or stickiness. It can cause blood clots through this estrogen-like effect--the risk of blood clots with tamoxifen is exactly the same as with birth control pills or hormone replacement.  Neither of these agents causes mood changes or weight gain, despite the popular belief that they can have these side effects. We have data from studies comparing either of these drugs with placebo, and in those cases the control group had the same amount of weight gain and depression as the group that took the drug.  Madysun has all this information in writing and she will review it further with Tom for making a decision but knows that she is status post hysterectomy and has already had cataract surgery so I think tamoxifen would be a particularly good choice for her  We will not start the antiestrogens though until she completes her radiation.  She will give me a call at that time.  Finally she is being scheduled for genetics.  She unfortunately missed her earlier appointment.  Since the visit with me last time she has learned that 1 of her sisters has breast cancer in fact it sounds like it stage III.  Edessa will see me again in October.  She knows to call for any problems that may develop before her next visit.  Magrinat, Virgie Dad, MD  10/12/17 11:31 AM Medical Oncology and Hematology [facility] [address] [place], [place] [address] Tel. [phone]    Fax. [phone]  This document serves as a record of services personally performed by Lurline Del, MD. It was created on his behalf by Sheron Nightingale, a trained medical scribe. The creation of this record is  based on the scribe's personal observations and the provider's statements to them.   I have reviewed the above documentation for accuracy and completeness, and I agree with the above.

## 2017-10-12 ENCOUNTER — Inpatient Hospital Stay: Payer: PPO | Attending: Oncology | Admitting: Oncology

## 2017-10-12 ENCOUNTER — Ambulatory Visit
Admission: RE | Admit: 2017-10-12 | Discharge: 2017-10-12 | Disposition: A | Discharge status: Home / Self Care | Payer: PPO | Source: Ambulatory Visit | Attending: Radiation Oncology | Admitting: Radiation Oncology

## 2017-10-12 ENCOUNTER — Other Ambulatory Visit: Payer: PPO

## 2017-10-12 ENCOUNTER — Telehealth: Payer: Self-pay | Admitting: Oncology

## 2017-10-12 ENCOUNTER — Encounter: Payer: PPO | Admitting: Genetics

## 2017-10-12 ENCOUNTER — Other Ambulatory Visit: Payer: Self-pay

## 2017-10-12 ENCOUNTER — Encounter: Payer: Self-pay | Admitting: Radiation Oncology

## 2017-10-12 VITALS — BP 140/79 | HR 73 | Temp 98.0°F | Resp 18 | Wt 156.1 lb

## 2017-10-12 VITALS — BP 158/68 | HR 64 | Temp 97.7°F | Resp 18 | Ht 64.0 in | Wt 155.8 lb

## 2017-10-12 DIAGNOSIS — C50411 Malignant neoplasm of upper-outer quadrant of right female breast: Secondary | ICD-10-CM

## 2017-10-12 DIAGNOSIS — Z170 Estrogen receptor positive status [ER+]: Secondary | ICD-10-CM

## 2017-10-12 DIAGNOSIS — Z9889 Other specified postprocedural states: Secondary | ICD-10-CM | POA: Diagnosis not present

## 2017-10-12 DIAGNOSIS — Z803 Family history of malignant neoplasm of breast: Secondary | ICD-10-CM | POA: Diagnosis not present

## 2017-10-12 NOTE — Progress Notes (Signed)
Radiation Oncology         [phone] ________________________________  Initial outpatient Consultation  Name: Stephanie Liu MRN: [id number]  Date: 10/12/2017  DOB: 1941-04-11  TK:[id number], Elta Guadel[place], MD  Rolm Bookbinder, MD   REFERRING PHYSICIAN: Rolm Bookbinder, MD  DIAGNOSIS: The encounter diagnosis was Malignant neoplasm of upper-outer quadrant of right breast in female, estrogen receptor positive ([place]).  pathologic stage T1b Nx, stage IA invasive ductal carcinoma, grade 2, estrogen and progesterone receptor positive, with an MIB-5% and no HER-2 amplification.   HISTORY OF PRESENT ILLNESS::Stephanie Liu is a 77 y.o. female who is seen out of the courtesy of Dr. Rolm Bookbinder for an opinion concerning radiation therapy as part of management of patient's recently diagnosed stage I estrogen receptor positive breast cancer.  [name] had routine screening mammography at [facility] on 07/26/2017 showing breast density category B.  There was a possible abnormality in the right breast. She underwent right breast ultrasonography on 07/30/2017 showing: a 1.3 cm irregular solid mass in the right breast upper outer quadrant highly suggestive of malignancy. An additional mass, possibly a lymph node in the right breast upper outer quadrant posterior depth was read as intermediate suspicion for malignancy.  Accordingly on 08/02/2017 Joie proceeded to biopsy of the 1.3 cm right breast mass in question. The pathology from this procedure showed ([id number]):  invasive ductal carcinoma, grade 1, estrogen receptor 95% positive, progesterone receptor 80% positive, with an MIB-1 of 5%, and no HER-2 amplification.  To further evaluate the second area of concern in the right breast she underwent breast MRI on 08/12/2017 with results showing: Known index cancer over the anterior third of the upper-outer right breast measuring 0.7 x 0.9 x 1.1 cm. Focus of indeterminate non mass enhancement over the posterior third of  the outer lower right breast measuring 0.5 x 0.8 x 1.7 cm. An MR guided biopsy of this other area was performed which revealed complex sclerosing lesion. It was recommended this be removed at the time of her planned cancer operation.   On April 8 the patient was taken to the operating room by Dr. Donne Hazel at which time she underwent a right breast seed guided lumpectomy and right breast seed guided excisional biopsy. Pathology from this surgery revealed invasive ductal carcinoma, Nottingham grade 2 of 3. The tumor measures 0.9 cm in greatest dimension. The surgical margins were clear but the lateral margin was less than 1 mm. The second lumpectomy site showed ductal hyperplasia without evidence of malignancy. An additional breast excision, right additional posterior location revealed atypical ductal hyperplasia. No evidence of malignancy. Sentinel node procedure was not performed given the patient's age of 33 as well as findings of an ER positive tumor, small  lesion. Patient has done well since her surgery. She is now seen in radiation oncology for discussion of potential radiation therapy as part of her overall management.   PREVIOUS RADIATION THERAPY: No  PAST MEDICAL HISTORY:  has a past medical history of CMV infection ([place]), Collagenous colitis, HTN (hypertension), Hypercholesterolemia, Normal nuclear stress test (03/18/2007), and Normal stress echocardiogram (02/03/2010).    PAST SURGICAL HISTORY: Past Surgical History:  Procedure Laterality Date  . APPENDECTOMY    . BREAST LUMPECTOMY WITH RADIOACTIVE SEED LOCALIZATION Right 09/13/2017   Procedure: RIGHT BREAST LUMPECTOMY WITH RADIOACTIVE SEED LOCALIZATION, AND RIGHT BREAST SEED GUIDED  EXCISIONAL BIOPSY;  Surgeon: Rolm Bookbinder, MD;  Location: [facility];  Service: General;  Laterality: Right;  . CATARACT EXTRACTION, BILATERAL    .  CHOLECYSTECTOMY  1972  . ROTATOR CUFF REPAIR Right   . TOTAL ABDOMINAL HYSTERECTOMY W/  BILATERAL SALPINGOOPHORECTOMY      FAMILY HISTORY: family history includes Breast cancer (age of onset: 84) in her sister. patient's sister is being scheduled for surgery in the near future.  GYNECOLOGIC HISTORY:  No LMP recorded. Patient has had a hysterectomy. Menarche: 77 years old Age at first live birth: 77 years old [place] P2 History of ectopic pregnancy Status post total hysterectomy with BSO at age 84 Contraceptive: N/A HRT: 12 years, estrogen only    SOCIAL HISTORY:  reports that she has never smoked. She has never used smokeless tobacco. She reports that she drinks about 0.6 oz of alcohol per week. She reports that she does not use drugs.  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 [name], her husband is a Film/video editor, who retired in 2017.     ALLERGIES: Codeine and Sulfa antibiotics  MEDICATIONS:  Current Outpatient Medications  Medication Sig Dispense Refill  . aspirin 81 MG EC tablet Take 81 mg by mouth 3 (three) times a week. Swallow whole.    . Biotin 2500 MCG CAPS Take 5,000 mcg by mouth daily.    . budesonide (ENTOCORT EC) 3 MG 24 hr capsule Take 3 mg by mouth every morning.   5  . Coenzyme Q10 (COQ-10) 400 MG CAPS Take 400 mg by mouth daily.    Marland Kitchen ezetimibe (ZETIA) 10 MG tablet Take 10 mg by mouth daily.    . metoprolol succinate (TOPROL-XL) 25 MG 24 hr tablet Take 25 mg by mouth daily.    . Multiple Vitamins-Minerals (PRESERVISION AREDS PO) Take by mouth daily.    . Probiotic Product (PROBIOTIC DAILY PO) Take by mouth.    . rosuvastatin (CRESTOR) 5 MG tablet Take 2.5 mg by mouth daily.     Marland Kitchen triamterene-hydrochlorothiazide (DYAZIDE) 37.5-25 MG capsule Take 0.5 capsules by mouth daily.     No current facility-administered medications for this encounter.     REVIEW OF SYSTEMS:  REVIEW OF SYSTEMS: A 10+ POINT REVIEW OF SYSTEMS WAS OBTAINED including neurology, dermatology, psychiatry, cardiac, respiratory, lymph, extremities, GI, GU,  musculoskeletal, constitutional, reproductive, HEENT. All pertinent positives are noted in the HPI. All others are negative. He has mild soreness in the right breast but is not requiring any pain medication. She denies any swelling in her right arm or hand.She has good range of motion of movement of her right arm and shoulder despite frozen shoulder disorder in the past and prior right rotator cuff surgery  PHYSICAL EXAM:  weight is 156 lb 2 oz (70.8 kg). Her temperature is 98 F (36.7 C). Her blood pressure is 140/79 and her pulse is 73. Her respiration is 18.    General: Alert and oriented, in no acute distress HEENT: Head is normocephalic. Extraocular movements are intact. Oropharynx is clear. Neck: Neck is supple, no palpable cervical or supraclavicular lymphadenopathy. Heart: Regular in rate and rhythm with no murmurs, rubs, or gallops. Chest: Clear to auscultation bilaterally, with no rhonchi, wheezes, or rales. Abdomen: Soft, nontender, nondistended, with no rigidity or guarding. Extremities: No cyanosis or edema. Lymphatics: see Neck Exam Skin: No concerning lesions. Musculoskeletal: symmetric strength and muscle tone in upper extremities Neurologic: Cranial nerves II through XII are grossly intact. No obvious focalities. Speech is fluent. Coordination is intact. Psychiatric: Judgment and insight are intact. Affect is appropriate.  Left breast: reveals no palpable mass nipple discharge or bleeding. Right breast:  Patient has a well healing scar in the peri-areolar area, upper outer quadrant location. The patient has some swelling in the breast and mild bruising. No signs of infection or drainage  ECOG = 0   LABORATORY DATA:  Lab Results  Component Value Date   WBC 9.0 08/16/2017   HGB 15.2 08/16/2017   HCT 45.1 08/16/2017   MCV 92.8 08/16/2017   PLT 225 08/16/2017   NEUTROABS 6.0 08/16/2017   Lab Results  Component Value Date   NA 136 08/16/2017   K 4.8 08/16/2017   CL 99  08/16/2017   CO2 30 (H) 08/16/2017   GLUCOSE 98 08/16/2017   CREATININE 0.84 08/16/2017   CALCIUM 9.8 08/16/2017      RADIOGRAPHY: No results found.    IMPRESSION: 77 y.o.-year-old [place] woman status seed guided lumpectomy 09/13/2017 for a pathologic stage T1b Nx, stage IA invasive ductal carcinoma, grade 2, estrogen and progesterone receptor positive, with an MIB-5% and no HER-2 amplification. Oncotype DX testing shows low risk for distant recurrence and therefore adjuvant chemotherapy is not recommended. We discussed options for management at this time including adjuvant hormonal therapy and radiation therapy as breast conserving treatment. We also discussed consideration for adjuvant hormonal therapy alone. We discussed results of randomized studies (CAL[facility]) evaluating patients in this situation in particular the potential benefit of radiation therapy in addition to hormonal therapy. At this time the patient does wish to proceed with adjuvant hormonal therapy and will meet with Dr. Jana Hakim later this morning for further discussion of this therapy. She understands that her risk of recurrence in the breast without radiation therapy would be approximately 10% over 10 years. With radiation therapy the chances of recurrence within the breast area would be approximately 2%. There would be no benefit in terms of overall survival by adding radiation therapy to her above-mentioned regimen. She does have a close surgical margin but patients included in this study had close margins as long as there was "no tumor on ink". I have provided the patient and her husband copies of relevant papers to review. Patient would like to have her genetic testing appointment rescheduled especially since one of  her sisters has been recently diagnosed with breast cancer. I discussed the general course of treatment potential side effects and potential long-term toxicities of right breast radiation therapy with the patient and her  husband.    PLAN:The patient did meet with Dr. Jana Hakim today and after talking with him she has decided to proceed with radiation therapy as part of her overall management. She will be scheduled for CT simulation approximately 4-1/2 weeks out from her surgery with treatments to begin approximately 5-6 weeks after her surgery. The patient will likely be treated with a hypo-fractionated accelerated course of treatment.   ------------------------------------------------  Blair Promise, PhD, MD

## 2017-10-12 NOTE — Telephone Encounter (Signed)
Gave avs and calendar ° °

## 2017-10-18 ENCOUNTER — Ambulatory Visit
Admission: RE | Admit: 2017-10-18 | Discharge: 2017-10-18 | Disposition: A | Discharge status: Home / Self Care | Payer: PPO | Source: Ambulatory Visit | Attending: Radiation Oncology | Admitting: Radiation Oncology

## 2017-10-18 DIAGNOSIS — Z170 Estrogen receptor positive status [ER+]: Secondary | ICD-10-CM | POA: Diagnosis not present

## 2017-10-18 DIAGNOSIS — C50411 Malignant neoplasm of upper-outer quadrant of right female breast: Secondary | ICD-10-CM | POA: Insufficient documentation

## 2017-10-18 DIAGNOSIS — Z510 Encounter for antineoplastic radiation therapy: Secondary | ICD-10-CM | POA: Diagnosis not present

## 2017-10-18 NOTE — Addendum Note (Signed)
Encounter addended by: Gery Pray, MD on: 10/18/2017 3:06 PM  Actions taken: Sign clinical note

## 2017-10-18 NOTE — Progress Notes (Addendum)
  Radiation Oncology         [phone]) [phone] ________________________________  Name: Stephanie Liu MRN: [id number]  Date: 10/18/2017  DOB: [birth year]  SIMULATION AND TREATMENT PLANNING NOTE    ICD-10-CM   1. Malignant neoplasm of upper-outer quadrant of right breast in female, estrogen receptor positive ([place]) C50.411    Z17.0     DIAGNOSIS:  77 y.o. female with malignant neoplasm of upper-outer quadrant of right breast in female, estrogen receptor positive ([place]). Pathologic stageT1b Nx, stage IAinvasive ductal carcinoma, grade 2, estrogen and progesterone receptor positive, with an MIB-5% and no HER-2 amplification.  NARRATIVE:  The patient was brought to the [facility].  Identity was confirmed.  All relevant records and images related to the planned course of therapy were reviewed.  The patient freely provided informed written consent to proceed with treatment after reviewing the details related to the planned course of therapy. The consent form was witnessed and verified by the simulation staff.  Then, the patient was set-up in a stable reproducible  supine position for radiation therapy.  CT images were obtained.  Surface markings were placed.  The CT images were loaded into the planning software.  Then the target and avoidance structures were contoured.  Treatment planning then occurred.  The radiation prescription was entered and confirmed.  Then, I designed and supervised the construction of a total of 3 medically necessary complex treatment devices.  I have requested : 3D Simulation  I have requested a DVH of the following structures: heart, lungs, lumpectomy cavity.  I have ordered: Dose calc.  PLAN:  The patient will receive 40.05 Gy in 15 fractions followed by a boost to the lumpectomy cavity of 10 Gy in 5 fractions.  Optical Surface Tracking Plan:  Since intensity modulated radiotherapy (IMRT) and 3D conformal radiation treatment methods are predicated on accurate  and precise positioning for treatment, intrafraction motion monitoring is medically necessary to ensure accurate and safe treatment delivery.  The ability to quantify intrafraction motion without excessive ionizing radiation dose can only be performed with optical surface tracking. Accordingly, surface imaging offers the opportunity to obtain 3D measurements of patient position throughout IMRT and 3D treatments without excessive radiation exposure.  I am ordering optical surface tracking for this patient's upcoming course of radiotherapy. ________________________________    -----------------------------------  Blair Promise, PhD, MD  This document serves as a record of services personally performed by Gery Pray, MD. It was created on his behalf by Rae Lips, a trained medical scribe. The creation of this record is based on the scribe's personal observations and the provider's statements to them. This document has been checked and approved by the attending provider.

## 2017-10-19 DIAGNOSIS — L570 Actinic keratosis: Secondary | ICD-10-CM | POA: Diagnosis not present

## 2017-10-19 DIAGNOSIS — D485 Neoplasm of uncertain behavior of skin: Secondary | ICD-10-CM | POA: Diagnosis not present

## 2017-10-19 DIAGNOSIS — D2262 Melanocytic nevi of left upper limb, including shoulder: Secondary | ICD-10-CM | POA: Diagnosis not present

## 2017-10-19 DIAGNOSIS — L820 Inflamed seborrheic keratosis: Secondary | ICD-10-CM | POA: Diagnosis not present

## 2017-10-19 DIAGNOSIS — D1801 Hemangioma of skin and subcutaneous tissue: Secondary | ICD-10-CM | POA: Diagnosis not present

## 2017-10-19 DIAGNOSIS — L821 Other seborrheic keratosis: Secondary | ICD-10-CM | POA: Diagnosis not present

## 2017-10-19 DIAGNOSIS — K130 Diseases of lips: Secondary | ICD-10-CM | POA: Diagnosis not present

## 2017-10-20 DIAGNOSIS — C50411 Malignant neoplasm of upper-outer quadrant of right female breast: Secondary | ICD-10-CM | POA: Diagnosis not present

## 2017-10-20 DIAGNOSIS — Z510 Encounter for antineoplastic radiation therapy: Secondary | ICD-10-CM | POA: Diagnosis not present

## 2017-10-25 ENCOUNTER — Ambulatory Visit
Admission: RE | Admit: 2017-10-25 | Discharge: 2017-10-25 | Disposition: A | Discharge status: Home / Self Care | Payer: PPO | Source: Ambulatory Visit | Attending: Radiation Oncology | Admitting: Radiation Oncology

## 2017-10-25 DIAGNOSIS — C50411 Malignant neoplasm of upper-outer quadrant of right female breast: Secondary | ICD-10-CM | POA: Diagnosis not present

## 2017-10-25 DIAGNOSIS — Z170 Estrogen receptor positive status [ER+]: Secondary | ICD-10-CM

## 2017-10-25 DIAGNOSIS — Z510 Encounter for antineoplastic radiation therapy: Secondary | ICD-10-CM | POA: Diagnosis not present

## 2017-10-26 ENCOUNTER — Ambulatory Visit
Admission: RE | Admit: 2017-10-26 | Discharge: 2017-10-26 | Disposition: A | Discharge status: Home / Self Care | Payer: PPO | Source: Ambulatory Visit | Attending: Radiation Oncology | Admitting: Radiation Oncology

## 2017-10-26 DIAGNOSIS — C50411 Malignant neoplasm of upper-outer quadrant of right female breast: Secondary | ICD-10-CM | POA: Diagnosis not present

## 2017-10-26 DIAGNOSIS — Z510 Encounter for antineoplastic radiation therapy: Secondary | ICD-10-CM | POA: Diagnosis not present

## 2017-10-26 DIAGNOSIS — Z170 Estrogen receptor positive status [ER+]: Secondary | ICD-10-CM

## 2017-10-26 MED ORDER — RADIAPLEXRX EX GEL
Freq: Two times a day (BID) | CUTANEOUS | Status: DC
  Administered 2017-10-26: 13:00:00 via TOPICAL

## 2017-10-26 NOTE — Progress Notes (Signed)
  Radiation Oncology         [phone]) [phone] ________________________________  Name: CHAU SAVELL MRN: [id number]  Date: 10/25/2017  DOB: 09-26-1940  Simulation Verification Note    ICD-10-CM   1. Malignant neoplasm of upper-outer quadrant of right breast in female, estrogen receptor positive ([place]) C50.411    Z17.0     Status: outpatient  NARRATIVE: The patient was brought to the treatment unit and placed in the planned treatment position. The clinical setup was verified. Then port films were obtained and uploaded to the radiation oncology medical record software.  The treatment beams were carefully compared against the planned radiation fields. The position location and shape of the radiation fields was reviewed. They targeted volume of tissue appears to be appropriately covered by the radiation beams. Organs at risk appear to be excluded as planned.  Based on my personal review, I approved the simulation verification. The patient's treatment will proceed as planned.  -----------------------------------  Blair Promise, PhD, MD

## 2017-10-27 ENCOUNTER — Ambulatory Visit
Admission: RE | Admit: 2017-10-27 | Discharge: 2017-10-27 | Disposition: A | Discharge status: Home / Self Care | Payer: PPO | Source: Ambulatory Visit | Attending: Radiation Oncology | Admitting: Radiation Oncology

## 2017-10-27 DIAGNOSIS — Z510 Encounter for antineoplastic radiation therapy: Secondary | ICD-10-CM | POA: Diagnosis not present

## 2017-10-27 DIAGNOSIS — C50411 Malignant neoplasm of upper-outer quadrant of right female breast: Secondary | ICD-10-CM | POA: Diagnosis not present

## 2017-10-28 ENCOUNTER — Ambulatory Visit
Admission: RE | Admit: 2017-10-28 | Discharge: 2017-10-28 | Disposition: A | Discharge status: Home / Self Care | Payer: PPO | Source: Ambulatory Visit | Attending: Radiation Oncology | Admitting: Radiation Oncology

## 2017-10-28 DIAGNOSIS — Z510 Encounter for antineoplastic radiation therapy: Secondary | ICD-10-CM | POA: Diagnosis not present

## 2017-10-28 DIAGNOSIS — C50411 Malignant neoplasm of upper-outer quadrant of right female breast: Secondary | ICD-10-CM | POA: Diagnosis not present

## 2017-10-29 ENCOUNTER — Ambulatory Visit
Admission: RE | Admit: 2017-10-29 | Discharge: 2017-10-29 | Disposition: A | Discharge status: Home / Self Care | Payer: PPO | Source: Ambulatory Visit | Attending: Radiation Oncology | Admitting: Radiation Oncology

## 2017-10-29 DIAGNOSIS — Z510 Encounter for antineoplastic radiation therapy: Secondary | ICD-10-CM | POA: Diagnosis not present

## 2017-10-29 DIAGNOSIS — C50411 Malignant neoplasm of upper-outer quadrant of right female breast: Secondary | ICD-10-CM | POA: Diagnosis not present

## 2017-11-02 ENCOUNTER — Ambulatory Visit
Admission: RE | Admit: 2017-11-02 | Discharge: 2017-11-02 | Disposition: A | Discharge status: Home / Self Care | Payer: PPO | Source: Ambulatory Visit | Attending: Radiation Oncology | Admitting: Radiation Oncology

## 2017-11-02 DIAGNOSIS — C50411 Malignant neoplasm of upper-outer quadrant of right female breast: Secondary | ICD-10-CM

## 2017-11-02 DIAGNOSIS — Z170 Estrogen receptor positive status [ER+]: Secondary | ICD-10-CM

## 2017-11-02 DIAGNOSIS — Z510 Encounter for antineoplastic radiation therapy: Secondary | ICD-10-CM | POA: Diagnosis not present

## 2017-11-02 MED ORDER — ALRA NON-METALLIC DEODORANT (RAD-ONC)
1.0000 "application " | Freq: Once | TOPICAL | Status: AC
  Administered 2017-11-02: 1 via TOPICAL

## 2017-11-03 ENCOUNTER — Ambulatory Visit
Admission: RE | Admit: 2017-11-03 | Discharge: 2017-11-03 | Disposition: A | Discharge status: Home / Self Care | Payer: PPO | Source: Ambulatory Visit | Attending: Radiation Oncology | Admitting: Radiation Oncology

## 2017-11-03 DIAGNOSIS — Z510 Encounter for antineoplastic radiation therapy: Secondary | ICD-10-CM | POA: Diagnosis not present

## 2017-11-03 DIAGNOSIS — C50411 Malignant neoplasm of upper-outer quadrant of right female breast: Secondary | ICD-10-CM | POA: Diagnosis not present

## 2017-11-04 ENCOUNTER — Ambulatory Visit
Admission: RE | Admit: 2017-11-04 | Discharge: 2017-11-04 | Disposition: A | Discharge status: Home / Self Care | Payer: PPO | Source: Ambulatory Visit | Attending: Radiation Oncology | Admitting: Radiation Oncology

## 2017-11-04 DIAGNOSIS — Z510 Encounter for antineoplastic radiation therapy: Secondary | ICD-10-CM | POA: Diagnosis not present

## 2017-11-04 DIAGNOSIS — C50411 Malignant neoplasm of upper-outer quadrant of right female breast: Secondary | ICD-10-CM | POA: Diagnosis not present

## 2017-11-04 MED FILL — EZETIMIBE 10 MG TABLET: 10 | 90 days supply | Qty: 90 | Fill #1

## 2017-11-05 ENCOUNTER — Ambulatory Visit
Admission: RE | Admit: 2017-11-05 | Discharge: 2017-11-05 | Disposition: A | Discharge status: Home / Self Care | Payer: PPO | Source: Ambulatory Visit | Attending: Radiation Oncology | Admitting: Radiation Oncology

## 2017-11-05 DIAGNOSIS — Z510 Encounter for antineoplastic radiation therapy: Secondary | ICD-10-CM | POA: Diagnosis not present

## 2017-11-05 DIAGNOSIS — C50411 Malignant neoplasm of upper-outer quadrant of right female breast: Secondary | ICD-10-CM | POA: Diagnosis not present

## 2017-11-08 ENCOUNTER — Ambulatory Visit
Admission: RE | Admit: 2017-11-08 | Discharge: 2017-11-08 | Disposition: A | Discharge status: Home / Self Care | Payer: PPO | Source: Ambulatory Visit | Attending: Radiation Oncology | Admitting: Radiation Oncology

## 2017-11-08 DIAGNOSIS — Z170 Estrogen receptor positive status [ER+]: Secondary | ICD-10-CM | POA: Diagnosis not present

## 2017-11-08 DIAGNOSIS — C50411 Malignant neoplasm of upper-outer quadrant of right female breast: Secondary | ICD-10-CM | POA: Diagnosis not present

## 2017-11-08 DIAGNOSIS — Z510 Encounter for antineoplastic radiation therapy: Secondary | ICD-10-CM | POA: Diagnosis not present

## 2017-11-09 ENCOUNTER — Ambulatory Visit
Admission: RE | Admit: 2017-11-09 | Discharge: 2017-11-09 | Disposition: A | Discharge status: Home / Self Care | Payer: PPO | Source: Ambulatory Visit | Attending: Radiation Oncology | Admitting: Radiation Oncology

## 2017-11-09 ENCOUNTER — Ambulatory Visit: Payer: PPO | Admitting: Radiation Oncology

## 2017-11-09 DIAGNOSIS — C50411 Malignant neoplasm of upper-outer quadrant of right female breast: Secondary | ICD-10-CM | POA: Diagnosis not present

## 2017-11-09 DIAGNOSIS — Z510 Encounter for antineoplastic radiation therapy: Secondary | ICD-10-CM | POA: Diagnosis not present

## 2017-11-10 ENCOUNTER — Ambulatory Visit
Admission: RE | Admit: 2017-11-10 | Discharge: 2017-11-10 | Disposition: A | Discharge status: Home / Self Care | Payer: PPO | Source: Ambulatory Visit | Attending: Radiation Oncology | Admitting: Radiation Oncology

## 2017-11-10 DIAGNOSIS — C50411 Malignant neoplasm of upper-outer quadrant of right female breast: Secondary | ICD-10-CM | POA: Diagnosis not present

## 2017-11-10 DIAGNOSIS — Z510 Encounter for antineoplastic radiation therapy: Secondary | ICD-10-CM | POA: Diagnosis not present

## 2017-11-11 ENCOUNTER — Ambulatory Visit
Admission: RE | Admit: 2017-11-11 | Discharge: 2017-11-11 | Disposition: A | Discharge status: Home / Self Care | Payer: PPO | Source: Ambulatory Visit | Attending: Radiation Oncology | Admitting: Radiation Oncology

## 2017-11-11 DIAGNOSIS — C50411 Malignant neoplasm of upper-outer quadrant of right female breast: Secondary | ICD-10-CM | POA: Diagnosis not present

## 2017-11-11 DIAGNOSIS — Z510 Encounter for antineoplastic radiation therapy: Secondary | ICD-10-CM | POA: Diagnosis not present

## 2017-11-12 ENCOUNTER — Ambulatory Visit
Admission: RE | Admit: 2017-11-12 | Discharge: 2017-11-12 | Disposition: A | Discharge status: Home / Self Care | Payer: PPO | Source: Ambulatory Visit | Attending: Radiation Oncology | Admitting: Radiation Oncology

## 2017-11-12 DIAGNOSIS — Z510 Encounter for antineoplastic radiation therapy: Secondary | ICD-10-CM | POA: Diagnosis not present

## 2017-11-12 DIAGNOSIS — C50411 Malignant neoplasm of upper-outer quadrant of right female breast: Secondary | ICD-10-CM | POA: Diagnosis not present

## 2017-11-15 ENCOUNTER — Ambulatory Visit
Admission: RE | Admit: 2017-11-15 | Discharge: 2017-11-15 | Disposition: A | Discharge status: Home / Self Care | Payer: PPO | Source: Ambulatory Visit | Attending: Radiation Oncology | Admitting: Radiation Oncology

## 2017-11-15 DIAGNOSIS — C50411 Malignant neoplasm of upper-outer quadrant of right female breast: Secondary | ICD-10-CM | POA: Diagnosis not present

## 2017-11-15 DIAGNOSIS — Z510 Encounter for antineoplastic radiation therapy: Secondary | ICD-10-CM | POA: Diagnosis not present

## 2017-11-16 ENCOUNTER — Ambulatory Visit
Admission: RE | Admit: 2017-11-16 | Discharge: 2017-11-16 | Disposition: A | Discharge status: Home / Self Care | Payer: PPO | Source: Ambulatory Visit | Attending: Radiation Oncology | Admitting: Radiation Oncology

## 2017-11-16 DIAGNOSIS — C50411 Malignant neoplasm of upper-outer quadrant of right female breast: Secondary | ICD-10-CM | POA: Diagnosis not present

## 2017-11-16 DIAGNOSIS — Z510 Encounter for antineoplastic radiation therapy: Secondary | ICD-10-CM | POA: Diagnosis not present

## 2017-11-17 ENCOUNTER — Ambulatory Visit
Admission: RE | Admit: 2017-11-17 | Discharge: 2017-11-17 | Disposition: A | Discharge status: Home / Self Care | Payer: PPO | Source: Ambulatory Visit | Attending: Radiation Oncology | Admitting: Radiation Oncology

## 2017-11-17 DIAGNOSIS — Z510 Encounter for antineoplastic radiation therapy: Secondary | ICD-10-CM | POA: Diagnosis not present

## 2017-11-17 DIAGNOSIS — C50411 Malignant neoplasm of upper-outer quadrant of right female breast: Secondary | ICD-10-CM | POA: Diagnosis not present

## 2017-11-18 ENCOUNTER — Ambulatory Visit
Admission: RE | Admit: 2017-11-18 | Discharge: 2017-11-18 | Disposition: A | Discharge status: Home / Self Care | Payer: PPO | Source: Ambulatory Visit | Attending: Radiation Oncology | Admitting: Radiation Oncology

## 2017-11-18 DIAGNOSIS — Z510 Encounter for antineoplastic radiation therapy: Secondary | ICD-10-CM | POA: Diagnosis not present

## 2017-11-18 DIAGNOSIS — C50411 Malignant neoplasm of upper-outer quadrant of right female breast: Secondary | ICD-10-CM | POA: Diagnosis not present

## 2017-11-19 ENCOUNTER — Ambulatory Visit
Admission: RE | Admit: 2017-11-19 | Discharge: 2017-11-19 | Disposition: A | Discharge status: Home / Self Care | Payer: PPO | Source: Ambulatory Visit | Attending: Radiation Oncology | Admitting: Radiation Oncology

## 2017-11-19 ENCOUNTER — Telehealth: Payer: Self-pay | Admitting: *Deleted

## 2017-11-19 DIAGNOSIS — C50411 Malignant neoplasm of upper-outer quadrant of right female breast: Secondary | ICD-10-CM | POA: Diagnosis not present

## 2017-11-19 DIAGNOSIS — Z510 Encounter for antineoplastic radiation therapy: Secondary | ICD-10-CM | POA: Diagnosis not present

## 2017-11-19 MED ORDER — TAMOXIFEN CITRATE 20 MG PO TABS: 20.0000 mg | ORAL_TABLET | Freq: Every day | ORAL | 12 refills | Status: DC

## 2017-11-19 MED FILL — TAMOXIFEN CITRATE 20 MG TAB: 20 | 30 days supply | Qty: 30 | Fill #0

## 2017-11-22 ENCOUNTER — Encounter: Payer: Self-pay | Admitting: Radiation Oncology

## 2017-11-22 ENCOUNTER — Ambulatory Visit
Admission: RE | Admit: 2017-11-22 | Discharge: 2017-11-22 | Disposition: A | Discharge status: Home / Self Care | Payer: PPO | Source: Ambulatory Visit | Attending: Radiation Oncology | Admitting: Radiation Oncology

## 2017-11-22 ENCOUNTER — Ambulatory Visit: Payer: PPO

## 2017-11-22 DIAGNOSIS — Z170 Estrogen receptor positive status [ER+]: Secondary | ICD-10-CM

## 2017-11-22 DIAGNOSIS — Z510 Encounter for antineoplastic radiation therapy: Secondary | ICD-10-CM | POA: Diagnosis not present

## 2017-11-22 DIAGNOSIS — C50411 Malignant neoplasm of upper-outer quadrant of right female breast: Secondary | ICD-10-CM

## 2017-11-22 MED ORDER — RADIAPLEXRX EX GEL
Freq: Once | CUTANEOUS | Status: AC
  Administered 2017-11-22: 14:00:00 via TOPICAL

## 2017-11-23 ENCOUNTER — Ambulatory Visit: Payer: PPO

## 2017-11-23 ENCOUNTER — Inpatient Hospital Stay: Payer: PPO | Attending: Oncology | Admitting: Genetics

## 2017-11-23 ENCOUNTER — Inpatient Hospital Stay: Payer: PPO

## 2017-11-23 DIAGNOSIS — C50919 Malignant neoplasm of unspecified site of unspecified female breast: Secondary | ICD-10-CM

## 2017-11-23 DIAGNOSIS — Z808 Family history of malignant neoplasm of other organs or systems: Secondary | ICD-10-CM

## 2017-11-23 DIAGNOSIS — Z8042 Family history of malignant neoplasm of prostate: Secondary | ICD-10-CM

## 2017-11-23 DIAGNOSIS — Z800 Family history of malignant neoplasm of digestive organs: Secondary | ICD-10-CM

## 2017-11-23 DIAGNOSIS — Z170 Estrogen receptor positive status [ER+]: Secondary | ICD-10-CM

## 2017-11-23 DIAGNOSIS — Z7183 Encounter for nonprocreative genetic counseling: Secondary | ICD-10-CM

## 2017-11-23 DIAGNOSIS — C50411 Malignant neoplasm of upper-outer quadrant of right female breast: Secondary | ICD-10-CM | POA: Diagnosis not present

## 2017-11-23 DIAGNOSIS — Z803 Family history of malignant neoplasm of breast: Secondary | ICD-10-CM

## 2017-11-24 ENCOUNTER — Encounter: Payer: Self-pay | Admitting: Genetics

## 2017-11-24 DIAGNOSIS — Z808 Family history of malignant neoplasm of other organs or systems: Secondary | ICD-10-CM | POA: Insufficient documentation

## 2017-11-24 DIAGNOSIS — Z8042 Family history of malignant neoplasm of prostate: Secondary | ICD-10-CM | POA: Insufficient documentation

## 2017-11-24 DIAGNOSIS — Z803 Family history of malignant neoplasm of breast: Secondary | ICD-10-CM | POA: Insufficient documentation

## 2017-11-24 DIAGNOSIS — Z800 Family history of malignant neoplasm of digestive organs: Secondary | ICD-10-CM | POA: Insufficient documentation

## 2017-11-24 NOTE — Progress Notes (Signed)
REFERRING PROVIDER: Chauncey Cruel, MD [address] [place], [place] [address]  PRIMARY PROVIDER:  Crist Infante, MD  PRIMARY REASON FOR VISIT:  1. Malignant neoplasm of female breast, unspecified estrogen receptor status, unspecified laterality, unspecified site of breast ([place])   2. Family history of breast cancer   3. Family history of pancreatic cancer   4. Family history of prostate cancer   5. Family history of melanoma   6. Malignant neoplasm of upper-outer quadrant of right breast in female, estrogen receptor positive ([place])    HISTORY OF PRESENT ILLNESS:   Stephanie Liu, a 77 y.o. female, was seen for a [facility] [place] cancer genetics consultation at the request of Dr. Jana Hakim due to a personal and family history of cancer.  Stephanie Liu presents to clinic today to discuss the possibility of a hereditary predisposition to cancer, genetic testing, and to further clarify her future cancer risks, as well as potential cancer risks for family members.   On 08/02/2017, at the age of 69, Stephanie Liu breast biopsy revealed Invasive ductal carcinoma, ER/PR+, HER2- of the  Right breast. This was treated with lumpectomy followed by adjuvant radiation and is planning antiestrogen therapy.    HORMONAL RISK FACTORS:  Menarche was at age 67.  First live birth at age 16.  Ovaries intact: no.  Hysterectomy: yes. At age 9 Menopausal status: postmenopausal.  HRT use: 12 years. Estrogen only Colonoscopy: yes; reportedly normal. Mammogram within the last year: yes.   Past Medical History:  Diagnosis Date  . 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 Collagenous colitis   . Family history of breast cancer   . Family history of melanoma   . Family history of pancreatic cancer   . Family history of prostate cancer   . HTN (hypertension)   . Hypercholesterolemia   . Normal nuclear stress test 03/18/2007  . Normal stress echocardiogram 02/03/2010   RBBB, CLINICALLY NEGATIVE FOR ANGINA, NORMAL LV  FUNCTION WITH MILD LVH    Past Surgical History:  Procedure Laterality Date  . APPENDECTOMY    . BREAST LUMPECTOMY WITH RADIOACTIVE SEED LOCALIZATION Right 09/13/2017   Procedure: RIGHT BREAST LUMPECTOMY WITH RADIOACTIVE SEED LOCALIZATION, AND RIGHT BREAST SEED GUIDED  EXCISIONAL BIOPSY;  Surgeon: Rolm Bookbinder, MD;  Location: [facility];  Service: General;  Laterality: Right;  . CATARACT EXTRACTION, BILATERAL    . CHOLECYSTECTOMY  1972  . ROTATOR CUFF REPAIR Right   . TOTAL ABDOMINAL HYSTERECTOMY W/ BILATERAL SALPINGOOPHORECTOMY      Social History   Socioeconomic History  . Marital status: Married    Spouse name: Not on file  . Number of children: Not on file  . Years of education: Not on file  . Highest education level: Not on file  Occupational History  . Not on file  Social Needs  . Financial resource strain: Not on file  . Food insecurity:    Worry: Not on file    Inability: Not on file  . Transportation needs:    Medical: Not on file    Non-medical: Not on file  Tobacco Use  . Smoking status: Never Smoker  . Smokeless tobacco: Never Used  Substance and Sexual Activity  . Alcohol use: Yes    Alcohol/week: 0.6 oz    Types: 1 Glasses of wine per week    Comment: Occasional  . Drug use: Never  . Sexual activity: Not on file  Lifestyle  . Physical activity:    Days per week: Not  on file    Minutes per session: Not on file  . Stress: Not on file  Relationships  . Social connections:    Talks on phone: Not on file    Gets together: Not on file    Attends religious service: Not on file    Active member of club or organization: Not on file    Attends meetings of clubs or organizations: Not on file    Relationship status: Not on file  Other Topics Concern  . Not on file  Social History Narrative  . Not on file     FAMILY HISTORY:  We obtained a detailed, 4-generation family history.  Significant diagnoses are listed below: Family History   Problem Relation Age of Onset  . Parkinson's disease Mother   . Alzheimer's disease Mother   . Prostate cancer Father        'spread outside of prostate'  . Breast cancer Sister 11  . Heart attack Brother   . Rheum arthritis Paternal Aunt   . Pancreatic cancer Cousin        40's/50's  . Anemia Neg Hx   . Arrhythmia Neg Hx   . Asthma Neg Hx   . Clotting disorder Neg Hx   . Fainting Neg Hx   . Heart disease Neg Hx   . Heart failure Neg Hx   . Hyperlipidemia Neg Hx   . Hypertension Neg Hx    Stephanie Liu has a 104 year-old daughter and a 38 year-old son with no history of cancer.  She has 5 grandchildren. Stephanie Liu has 2 sisters and 2 brothers:  -1 sister was diagnosed with breast cancer recently at the age of 33 -57 sister has a history of melanoma in her eye.  -1 brother died in his 33's/80's due to a heart attack.  -1 brother is 54 with no history of cancer.  No nieces or nephews with any history of cancer.   Stephanie Liu father: died at 79.  He had a history of prostate cancer that was diagnosed at age 56.  It spread outside of the prostate,.  Paternal aunts/Uncles: 2 paternal aunts, 1 had rheumatoid arthritis, the other died of age related disease.  Paternal cousins: 1 paternal cousin died of pancreatic cancer in his 73's/50's.  Paternal grandfather: died "elderly" with no history of cancer.  Paternal grandmother: died "elderly" with no history of cancer.   Stephanie Liu mother: died at 16, she had a history of Parkinson's disease and alzheimer's disease.  Maternal Aunts/Uncles: 1 maternal half aunt, and 49 maternal uncles (some were half, unk details). No known history of cancer.  Maternal cousins: no known history of cancer, but limited information about these relatives.  Maternal grandfather: died in old age with no known history of cancer.  Maternal grandmother:died in old age with no known history of cancer.   Stephanie Liu is unaware of previous family history  of genetic testing for hereditary cancer risks. Patient's maternal ancestors are of [place] descent, and paternal ancestors are of [place] descent. There is no reported Ashkenazi Jewish ancestry. There is no known consanguinity.  GENETIC COUNSELING ASSESSMENT: CHRYSTLE MURILLO is a 77 y.o. female with a personal and family history which is somewhat suggestive of a Hereditary Cancer Predisposition Syndrome. We, therefore, discussed and recommended the following at today's visit.   DISCUSSION: We reviewed the characteristics, features and inheritance patterns of hereditary cancer syndromes. We also discussed genetic testing, including the appropriate family members to test, the  process of testing, insurance coverage and turn-around-time for results. We discussed the implications of a negative, positive and/or variant of uncertain significant result. We recommended Stephanie Liu pursue genetic testing for the Common Hereditary Cancers gene panel + Melanoma panel.   The Common Hereditary Cancer Panel offered by [facility] includes sequencing and/or deletion duplication testing of the following 47 genes: APC, ATM, AXIN2, BARD1, BMPR1A, BRCA1, BRCA2, BRIP1, CDH1, CDKN2A (p14ARF), CDKN2A (p16INK4a), CKD4, CHEK2, CTNNA1, DICER1, EPCAM (Deletion/duplication testing only), GREM1 (promoter region deletion/duplication testing only), KIT, MEN1, MLH1, MSH2, MSH3, MSH6, MUTYH, NBN, NF1, NHTL1, PALB2, PDGFRA, PMS2, POLD1, POLE, PTEN, RAD50, RAD51C, RAD51D, SDHB, SDHC, SDHD, SMAD4, SMARCA4. STK11, TP53, TSC1, TSC2, and VHL.  The following genes were evaluated for sequence changes only: SDHA and HOXB13 c.251G>A variant only.  The Melanoma panel offered by [facility] includes sequencing and/or deletion duplication testing of the following 9 genes: BAP1, BRCA2, BRIP1, CDK4, CDKN2A (p14ARF), CDKN2A (p16INK4a), POT1, PTEN, RB1, and TP53.  The following gene was evaluated for sequence changes only: MITF (c.952G>A, p.GLU318Lys variant  only).  We discussed that only 5-10% of cancers are associated with a Hereditary cancer predisposition syndrome.  One of the most common hereditary cancer syndromes that increases breast cancer risk is called Hereditary Breast and Ovarian Cancer (HBOC) syndrome.  This syndrome is caused by mutations in the BRCA1 and BRCA2 genes.  This syndrome increases an individual's lifetime risk to develop breast, ovarian, pancreatic, and other types of cancer.  There are also many other cancer predisposition syndromes caused by mutations in several other genes.  We discussed that if she is found to have a mutation in one of these genes, it may impact surgical decisions, and alter future medical management recommendations such as increased cancer screenings and consideration of risk reducing surgeries.  A positive result could also have implications for the patient's family members.  A Negative result would mean we were unable to identify a hereditary component to her cancer, but does not rule out the possibility of a hereditary basis for her cancer.  There could be mutations that are undetectable by current technology, or in genes not yet tested or identified to increase cancer risk.    We discussed the potential to find a Variant of Uncertain Significance or VUS.  These are variants that have not yet been identified as pathogenic or benign, and it is unknown if this variant is associated with increased cancer risk or if this is a normal finding.  Most VUS's are reclassified to benign or likely benign.   It should not be used to make medical management decisions. With time, we suspect the lab will determine the significance of any VUS's identified if any.   Based on Stephanie Liu's personal and family history of cancer, she meets NCCN medical criteria for genetic testing. Despite that she meets criteria, her insurance may or may not cover testing, and she may still have an out of pocket cost.  The laboratory can  provide her with an estimate of her OOP cost.   PLAN: After considering the risks, benefits, and limitations, Stephanie Liu  provided informed consent to pursue genetic testing and the blood sample was sent to [facility] for analysis of the Common Hereditary Cancers Panel + Melanoma Panel. Results should be available within approximately 2-3 weeks' time, at which point they will be disclosed by telephone to Stephanie Liu, as will any additional recommendations warranted by these results. Stephanie Liu will receive a summary of her genetic counseling visit and  a copy of her results once available. This information will also be available in Epic. We encouraged Stephanie Liu to remain in contact with cancer genetics annually so that we can continuously update the family history and inform her of any changes in cancer genetics and testing that may be of benefit for her family. Stephanie Liu questions were answered to her satisfaction today. Our contact information was provided should additional questions or concerns arise.  Based on Stephanie Liu's family history, we recommended her paternal relatives also have genetic counseling and testing. Stephanie Liu will let us know if we can be of any assistance in coordinating genetic counseling and/or testing for this family member.   Lastly, we encouraged Stephanie Liu to remain in contact with cancer genetics annually so that we can continuously update the family history and inform her of any changes in cancer genetics and testing that may be of benefit for this family.   Stephanie Liu.  [place] questions were answered to her satisfaction today. Our contact information was provided should additional questions or concerns arise. Thank you for the referral and allowing [place] to share in the care of your patient.   Tana Felts, Stephanie Liu, [facility] Certified Genetic Counselor [email or web address] phone: [phone]  The patient was seen for a total of 40  minutes in face-to-face genetic counseling.  This patient was discussed with Drs. Magrinat, Lindi Adie and/or Burr Medico who agrees with the above.

## 2017-11-29 NOTE — Progress Notes (Signed)
  Radiation Oncology         [phone]) [phone] ________________________________  Name: Stephanie Liu MRN: [id number]  Date: 11/22/2017  DOB: Mar 03, 1941  End of Treatment Note  Diagnosis:   77 y.o. female with malignant neoplasm of upper-outer quadrant of right breast in female, estrogen receptor positive ([place]).Pathologic stageT1bNx, stage IAinvasive ductal carcinoma, grade2, estrogen and progesterone receptor positive, with an MIB-5% and no HER-2 amplification.     Indication for treatment:  Curative, breast conservation therapy       Radiation treatment dates:   10/25/2017 - 11/22/2017  Site/dose:    1. Right Breast / 40.05 Gy in 15 fractions 2. Right Breast Boost / 10 Gy in 5 fractions  Beams/energy:    1. 3D / 6X, 10X Photon 2. 3D / 10X, 6X Photon  Narrative: The patient tolerated radiation treatment relatively well.  She experienced mild fatigue and some discomfort in her right breast. Over the course of treatment she developed minimal erythema and hyperpigmentation changes, but no skin breakdown was noted. She is using Radiaplex 2-3 times daily.  Plan: The patient has completed radiation treatment. The patient will return to radiation oncology clinic for routine followup in one month. I advised them to call or return sooner if they have any questions or concerns related to their recovery or treatment.  -----------------------------------  Blair Promise, PhD, MD  This document serves as a record of services personally performed by Gery Pray, MD. It was created on his behalf by Rae Lips, a trained medical scribe. The creation of this record is based on the scribe's personal observations and the provider's statements to them. This document has been checked and approved by the attending provider.

## 2017-12-03 ENCOUNTER — Telehealth: Payer: Self-pay | Admitting: Genetics

## 2017-12-03 NOTE — Telephone Encounter (Signed)
Left message with her husband that her genetic test results were in and to have her call me back to discuss them with her.

## 2017-12-14 NOTE — Telephone Encounter (Signed)
No entry 

## 2017-12-15 ENCOUNTER — Ambulatory Visit: Payer: Self-pay | Admitting: Genetics

## 2017-12-15 ENCOUNTER — Encounter: Payer: Self-pay | Admitting: Genetics

## 2017-12-15 DIAGNOSIS — Z1379 Encounter for other screening for genetic and chromosomal anomalies: Secondary | ICD-10-CM | POA: Insufficient documentation

## 2017-12-15 NOTE — Telephone Encounter (Signed)
Revealed negative genetic testing.  Revealed that a VUS in BRIP1 was identified.   This normal result is reassuring and indicates that it is unlikely Stephanie Liu's cancer is due to a hereditary cause.  It is unlikely that there is an increased risk of another cancer due to a mutation in one of these genes.  However, genetic testing is not perfect, and cannot definitively rule out a hereditary cause.  It will be important for her to keep in contact with genetics to learn if any additional testing may be needed in the future.     Recommended paternal reatives and siblings (especially those affected with cancer) also have genetic testing.

## 2017-12-15 NOTE — Progress Notes (Signed)
HPI:  Ms. Kleinschmidt was previously seen in the [facility] on 11/23/2017 due to a personal and family history of cancer and concerns regarding a hereditary predisposition to cancer. Please refer to our prior cancer genetics clinic note for more information regarding Ms. Zuccaro's medical, social and family histories, and our assessment and recommendations, at the time. Ms. Sparr recent genetic test results were disclosed to her, as well as recommendations warranted by these results. These results and recommendations are discussed in more detail below.  CANCER HISTORY:    Malignant neoplasm of upper-outer quadrant of right breast in female, estrogen receptor positive ([place])   08/16/2017 Initial Diagnosis    Malignant neoplasm of upper-outer quadrant of right breast in female, estrogen receptor positive ([place])      11/29/2017 Genetic Testing    The Common Heredtiary [place] Panel + melanoma panel was ordered (51 genes). The following genes were evaluated for sequence changes and exonic deletions/duplications:APC, ATM, AXIN2, BAP1, BARD1, BMPR1A, BRCA1, BRCA2, BRIP1, CDH1, CDK4, CDKN2A (p14ARF), CDKN2A (p16INK4a), CHEK2,CTNNA1, DICER1, EPCAM*, GREM1*, KIT, MEN1, MLH1, MSH2, MSH3, MSH6, MUTYH, NBN, NF1, PALB2, PDGFRA, PMS2, POLD1, POLE, POT1, PTEN, RAD50, RAD51C, RAD51D, RB1, SDHB, SDHC, SDHD, SMAD4, SMARCA4, STK11, TP53, TSC1, TSC2, VHL. The following genes were evaluated for sequence changes only: HOXB13*, MITF*, NTHL1*, SDHA  Results: No pathogenic variants identified.  A variant of uncertain significance in the gene BRIP1 was detected c.1655T>C (p.Ile552Thr).  The date of this test report is 11/29/2017.         FAMILY HISTORY:  We obtained a detailed, 4-generation family history.  Significant diagnoses are listed below: Family History  Problem Relation Age of Onset  . Parkinson's disease Mother   . Alzheimer's disease Mother   . Prostate cancer Father        'spread  outside of prostate'  . Breast cancer Sister 51  . Heart attack Brother   . Rheum arthritis Paternal Aunt   . Pancreatic cancer Cousin        40's/50's  . Anemia Neg Hx   . Arrhythmia Neg Hx   . Asthma Neg Hx   . Clotting disorder Neg Hx   . Fainting Neg Hx   . Heart disease Neg Hx   . Heart failure Neg Hx   . Hyperlipidemia Neg Hx   . Hypertension Neg Hx     Ms. Moder has a 67 year-old daughter and a 13 year-old son with no history of cancer.  She has 5 grandchildren. Ms. Lindon has 2 sisters and 2 brothers:  -1 sister was diagnosed with breast cancer recently at the age of 73 -56 sister has a history of melanoma in her eye.  -1 brother died in his 39's/80's due to a heart attack.  -1 brother is 67 with no history of cancer.  No nieces or nephews with any history of cancer.   Ms. Urick father: died at 57.  He had a history of prostate cancer that was diagnosed at age 54.  It spread outside of the prostate,.  Paternal aunts/Uncles: 2 paternal aunts, 1 had rheumatoid arthritis, the other died of age related disease.  Paternal cousins: 1 paternal cousin died of pancreatic cancer in his 59's/50's.  Paternal grandfather: died "elderly" with no history of cancer.  Paternal grandmother: died "elderly" with no history of cancer.   Ms. Picking mother: died at 82, she had a history of Parkinson's disease and alzheimer's disease.  Maternal Aunts/Uncles: 1 maternal half aunt, and 53  maternal uncles (some were half, unk details). No known history of cancer.  Maternal cousins: no known history of cancer, but limited information about these relatives.  Maternal grandfather: died in old age with no known history of cancer.  Maternal grandmother:died in old age with no known history of cancer.   Ms. Brassfield is unaware of previous family history of genetic testing for hereditary cancer risks. Patient's maternal ancestors are of [place] descent, and paternal ancestors are of [place]  descent. There is no reported Ashkenazi Jewish ancestry. There is no known consanguinity.  GENETIC TEST RESULTS: Genetic testing performed through [facility] Common Hereditary Cancers Panel + melanoma panel reported out on 11/29/2017 showed no pathogenic mutations. The following genes were evaluated for sequence changes and exonic deletions/duplications: APC, ATM, AXIN2, BAP1, BARD1, BMPR1A, BRCA1, BRCA2, BRIP1, CDH1, CDK4, CDKN2A (p14ARF), CDKN2A (p16INK4a), CHEK2, CTNNA1, DICER1, EPCAM*, GREM1*, KIT, MEN1, MLH1, MSH2, MSH3, MSH6, MUTYH, NBN, NF1, PALB2, PDGFRA, PMS2, POLD1, POLE, POT1, PTEN, RAD50, RAD51C, RAD51D, RB1, SDHB, SDHC, SDHD, SMAD4, SMARCA4, STK11, TP53, TSC1, TSC2, VHL. The following genes were evaluated for sequence changes only: HOXB13*, MITF*, NTHL1*, SDHA.  A variant of uncertain significance (VUS) in a gene called BRIP1 was also noted. c.1655T>C (p.Ile552Thr)  The test report will be scanned into EPIC and will be located under the Molecular Pathology section of the Results Review tab. A portion of the result report is included below for reference.     We discussed with Ms. Mauney that because current genetic testing is not perfect, it is possible there may be a gene mutation in one of these genes that current testing cannot detect, but that chance is small.  We also discussed, that there could be another gene that has not yet been discovered, or that we have not yet tested, that is responsible for the cancer diagnoses in the family. It is also possible there is a hereditary cause for the cancer in the family that Ms. Relph did not inherit and therefore was not identified in her testing.  Therefore, it is important to remain in touch with cancer genetics in the future so that we can continue to offer Ms. Golden the most up to date genetic testing.   Regarding the VUS in BRIP1: At this time, it is unknown if this variant is associated with increased cancer risk or if this is a  normal finding, but most variants such as this get reclassified to being inconsequential. It should not be used to make medical management decisions. With time, we suspect the lab will determine the significance of this variant, if any. If we do learn more about it, we will try to contact Ms. Colclasure to discuss it further. However, it is important to stay in touch with [place] periodically and keep the address and phone number up to date.  ADDITIONAL GENETIC TESTING: We discussed with Ms. Lukic that there are other genes that are associated with increased cancer risk that can be analyzed. The laboratories that offer this testing look at these additional genes via a hereditary cancer gene panel. Should Ms. Marolf wish to pursue additional genetic testing, we are happy to discuss and coordinate this testing, at any time.    CANCER SCREENING RECOMMENDATIONS: Ms. Sorn test result is considered negative (normal).  This means that we have not identified a hereditary cause for her personal and family history of cancer at this time.   While reassuring, this does not definitively rule out a hereditary predisposition to cancer. It is still  possible that there could be genetic mutations that are undetectable by current technology, or genetic mutations in genes that have not been tested or identified to increase cancer risk.  Therefore, it is recommended she continue to follow the cancer management and screening guidelines provided by her oncology and primary healthcare provider. An individual's cancer risk is not determined by genetic test results alone.  Overall cancer risk assessment includes additional factors such as personal medical history, family history, etc.  These should be used to make a personalized plan for cancer prevention and surveillance.    RECOMMENDATIONS FOR FAMILY MEMBERS:  Relatives in this family might be at some increased risk of developing cancer, over the general population risk,  simply due to the family history of cancer.  We recommended women in this family have a yearly mammogram beginning at age 67, or 33 years younger than the earliest onset of cancer, an annual clinical breast exam, and perform monthly breast self-exams. Women in this family should also have a gynecological exam as recommended by their primary provider. All family members should have a colonoscopy by age 41 (or as directed by their doctors).  All family members should inform their physicians about the family history of cancer so their doctors can make the most appropriate screening recommendations for them.   It is also possible there is a hereditary cause for the cancer in Ms. Kocian's family that she did not inherit and therefore was not identified in her.  Therefore, we recommended her siblings/paternal relatives (especially those affected with cancer) also have genetic counseling and testing. Ms. Mapel will let us know if we can be of any assistance in coordinating genetic counseling and/or testing for these family members.   FOLLOW-UP: Lastly, we discussed with Ms. Ferrera that cancer genetics is a rapidly advancing field and it is possible that new genetic tests will be appropriate for her and/or her family members in the future. We encouraged her to remain in contact with cancer genetics on an annual basis so we can update her personal and family histories and let her know of advances in cancer genetics that may benefit this family.   Our contact number was provided. Ms. Ildefonso questions were answered to her satisfaction, and she knows she is welcome to call us at anytime with additional questions or concerns.   Ferol Luz, MS, [facility] Certified Genetic Counselor [email or web address]

## 2017-12-20 MED FILL — METOPROLOL SUCCINATE ER 25: 25 | 90 days supply | Qty: 90 | Fill #1

## 2017-12-21 MED FILL — ROSUVASTATIN CALCIUM 5 MG T: 5 | 90 days supply | Qty: 90 | Fill #0

## 2017-12-23 ENCOUNTER — Ambulatory Visit: Payer: PPO | Admitting: Radiation Oncology

## 2017-12-30 ENCOUNTER — Encounter: Payer: Self-pay | Admitting: Radiation Oncology

## 2017-12-30 ENCOUNTER — Ambulatory Visit
Admission: RE | Admit: 2017-12-30 | Discharge: 2017-12-30 | Disposition: A | Discharge status: Home / Self Care | Payer: PPO | Source: Ambulatory Visit | Attending: Radiation Oncology | Admitting: Radiation Oncology

## 2017-12-30 ENCOUNTER — Other Ambulatory Visit: Payer: Self-pay

## 2017-12-30 VITALS — BP 135/80 | HR 78 | Temp 97.7°F | Resp 18

## 2017-12-30 DIAGNOSIS — Z885 Allergy status to narcotic agent status: Secondary | ICD-10-CM | POA: Insufficient documentation

## 2017-12-30 DIAGNOSIS — Z170 Estrogen receptor positive status [ER+]: Secondary | ICD-10-CM | POA: Diagnosis not present

## 2017-12-30 DIAGNOSIS — Z7982 Long term (current) use of aspirin: Secondary | ICD-10-CM | POA: Insufficient documentation

## 2017-12-30 DIAGNOSIS — Z79899 Other long term (current) drug therapy: Secondary | ICD-10-CM | POA: Insufficient documentation

## 2017-12-30 DIAGNOSIS — R5383 Other fatigue: Secondary | ICD-10-CM | POA: Diagnosis not present

## 2017-12-30 DIAGNOSIS — C50411 Malignant neoplasm of upper-outer quadrant of right female breast: Secondary | ICD-10-CM | POA: Diagnosis not present

## 2017-12-30 DIAGNOSIS — Z882 Allergy status to sulfonamides status: Secondary | ICD-10-CM | POA: Diagnosis not present

## 2017-12-30 NOTE — Progress Notes (Signed)
Radiation Oncology         [phone] ________________________________  Name: Stephanie Liu MRN: [id number]  Date: 12/30/2017  DOB: 07-Jun-1941  Follow-Up Visit Note  CC: Crist Infante, MD  Rolm Bookbinder, MD    ICD-10-CM   1. Malignant neoplasm of upper-outer quadrant of right breast in female, estrogen receptor positive ([place]) C50.411    Z17.0     Diagnosis:   77 y.o. female with malignant neoplasm of upper-outer quadrant of right breast in female, estrogen receptor positive ([place]).Pathologic stageT1bNx, stage IAinvasive ductal carcinoma, grade2, estrogen and progesterone receptor positive, with an MIB-5% and no HER-2 amplification.   Interval Since Last Radiation:  1 month   Radiation treatment dates:   10/25/2017 - 11/22/2017  Site/dose:    1. Right Breast / 40.05 Gy in 15 fractions 2. Right Breast Boost / 10 Gy in 5 fractions  Narrative:  The patient returns today for routine follow-up. She is doing well overall. She endorses fatigue, but is unsure if it is related to her age, heat or radiation. Her skin is doing well, adding mild hyperpigmentation to her right axilla. She is tolerating tamoxifen well at this time. She is planning on traveling within the next few months. She denies pain.                     ALLERGIES:  is allergic to codeine and sulfa antibiotics.  Meds: Current Outpatient Medications  Medication Sig Dispense Refill  . aspirin 81 MG EC tablet Take 81 mg by mouth 3 (three) times a week. Swallow whole.    . Biotin 2500 MCG CAPS Take 5,000 mcg by mouth daily.    . budesonide (ENTOCORT EC) 3 MG 24 hr capsule Take 3 mg by mouth every morning.   5  . Coenzyme Q10 (COQ-10) 400 MG CAPS Take 400 mg by mouth daily.    Marland Kitchen ezetimibe (ZETIA) 10 MG tablet Take 10 mg by mouth daily.    . metoprolol succinate (TOPROL-XL) 25 MG 24 hr tablet Take 25 mg by mouth daily.    . Multiple Vitamins-Minerals (PRESERVISION AREDS PO) Take by mouth daily.    . Probiotic  Product (PROBIOTIC DAILY PO) Take by mouth.    . rosuvastatin (CRESTOR) 5 MG tablet Take 2.5 mg by mouth daily.     . tamoxifen (NOLVADEX) 20 MG tablet Take 1 tablet (20 mg total) by mouth daily. 30 tablet 12  . triamterene-hydrochlorothiazide (DYAZIDE) 37.5-25 MG capsule Take 0.5 capsules by mouth daily.     No current facility-administered medications for this encounter.     Physical Findings: The patient is in no acute distress. Patient is alert and oriented.  oral temperature is 97.7 F (36.5 C). Her blood pressure is 135/80 and her pulse is 78. Her respiration is 18. .   Lungs are clear to auscultation bilaterally. Heart has regular rate and rhythm. No palpable cervical, supraclavicular, or axillary adenopathy. Abdomen soft, non-tender, normal bowel sounds.  Right Breast: Patient's skin is healing well. Mild hyperpigmentation and edema. No dominant mass, nipple discharge or bleeding.   Lab Findings: Lab Results  Component Value Date   WBC 9.0 08/16/2017   HGB 15.2 08/16/2017   HCT 45.1 08/16/2017   MCV 92.8 08/16/2017   PLT 225 08/16/2017    Radiographic Findings: No results found.  Impression:  No evidence of reoccurrence on clinical exam. Pt is recovering well.  Plan:  PRN follow-up in Radiation Oncology. She will continue close follow  up with Medical Oncology and Surgery. She will continue on adjuvant hormonal therapy.  ____________________________________  Blair Promise, PhD, MD   This document serves as a record of services personally performed by Blair Promise, PhD, MD. It was created on his behalf by Margit Banda, a trained medical scribe. The creation of this record is based on the scribe's personal observations and the provider's statements to them. This document has been checked and approved by the attending provider.

## 2017-12-30 NOTE — Progress Notes (Signed)
  Radiation Oncology         (336) [phone] ________________________________  Name: ADELIA BAPTISTA MRN: [id number]  Date: 11/02/17  DOB: [birth year]  Weekly Radiation Therapy Management    ICD-10-CM   1. Malignant neoplasm of upper-outer quadrant of right breast in female, estrogen receptor positive ([place]) C50.411    Z17.0      Current Dose: 16.02 Gy     Planned Dose:  50.05 Gy  Narrative . . . . . . . . The patient presents for routine under treatment assessment.                                   The patient is without complaint.                                  Set-up films were reviewed.                                 The chart was checked. Physical Findings. . .  oral temperature is 97.7 F (36.5 C). Her blood pressure is 135/80 and her pulse is 78. Her respiration is 18. . Weight essentially stable. The lungs are clear. The heart has a regular rhythm and rate.  The right breast area shows minimal hyperpigmentation changes and erythema Impression . . . . . . . The patient is tolerating radiation. Plan . . . . . . . . . . . . Continue treatment as planned.  ________________________________   Blair Promise, PhD, MD

## 2017-12-30 NOTE — Progress Notes (Signed)
Ms. Gores is here for a  Follow-up appointment. Denies any pain. States that she has mild fatigue. States that she has some discoloration to her skin. States that she has sharp shooting pains sometime. Vitals:   12/30/17 1156  BP: 135/80  Pulse: 78  Resp: 18  Temp: 97.7 F (36.5 C)  TempSrc: Oral

## 2018-01-03 MED FILL — TRIAMTERENE/HCTZ 37.5/25 TB: 37.5-25 | 90 days supply | Qty: 45 | Fill #2

## 2018-01-18 MED FILL — EZETIMIBE 10 MG TABLET: 10 | 90 days supply | Qty: 90 | Fill #2

## 2018-01-18 MED FILL — TAMOXIFEN CITRATE 20 MG TAB: 20 | 30 days supply | Qty: 30 | Fill #1

## 2018-01-21 DIAGNOSIS — C50911 Malignant neoplasm of unspecified site of right female breast: Secondary | ICD-10-CM | POA: Diagnosis not present

## 2018-02-08 MED FILL — TAMOXIFEN CITRATE 20 MG TAB: 20 | 30 days supply | Qty: 30 | Fill #2

## 2018-03-05 DIAGNOSIS — Z23 Encounter for immunization: Secondary | ICD-10-CM | POA: Diagnosis not present

## 2018-03-11 NOTE — Progress Notes (Signed)
[place]  Telephone:(336) [phone] Fax:(336) [phone]     ID: Stephanie Liu DOB: [birth year]  MR#: [id number]  YKD#:[id number]  Patient Care Team: Crist Infante, MD as PCP - General (Internal Medicine) Avilene Marrin, Virgie Dad, MD as Consulting Physician (Oncology) Ronald Lobo, MD as Consulting Physician (Gastroenterology) Rolm Bookbinder, MD as Consulting Physician (General Surgery) Gery Pray, MD as Consulting Physician (Radiation Oncology) Latanya Maudlin, MD as Consulting Physician (Orthopedic Surgery) OTHER MD:  CHIEF COMPLAINT: Estrogen receptor positive breast cancer  CURRENT TREATMENT: tamoxifen  HISTORY OF CURRENT ILLNESS: From the original intake note:  Stephanie Liu had routine screening mammography at [facility] on 07/26/2017 showing breast density category B.  There was a possible abnormality in the right breast. She underwent right breast ultrasonography on 07/30/2017 showing: a 1.3 cm irregular solid mass in there right breast upper outer quadrant highly suggestive of malignancy. An additional mass, possibly a lymph node in the right breast upper outer quadrant posterior depth was read as intermediate suspicion for malignancy.  Accordingly on 08/02/2017 Stephanie Liu proceeded to biopsy of the 1.3 cm right breast mass in question. The pathology from this procedure showed ([id number]):  invasive ductal carcinoma, grade 1, estrogen receptor 95% positive, progesterone receptor 80% positive, with an MIB-1 of 5%, and no HER-2 amplification, the signals ratio being 1.06 and the number per cell 1.90.Marland Kitchen   To further evaluate the second area of concern in the right breast she underwent breast MRI on 08/12/2017 with results showing: Known index cancer over the anterior third of the upper-outer right breast measuring 0.7 x 0.9 x 1.1 cm. Focus of indeterminate non mass enhancement over the posterior third of the outer lower right breast measuring 0.5 x 0.8 x 1.7 cm.  The patient's subsequent  history is as detailed below.  INTERVAL HISTORY: Stephanie Liu returns today for follow up and treatment of her estrogen receptor positive breast cancer. She continues on tamoxifen which she started mid July 2019, with good tolerance. She denies issues with hot flashes or increased vaginal discharge. After several weeks she had an increased appetite.   Since her last visit, she completed radiation treatments on 11/22/2017. She tolerated this well. She had some fatigue. She also felt sharpe and shooting pain in her breast.    REVIEW OF SYSTEMS: Stephanie Liu reports that her sister recently moved to [place] and requires quite a bit of support.  Stephanie Liu continues to have loose to diarrheal bowel movements twice daily.  She is currently off budesonide.  She is planning on going on a trip to [place]. She denies unusual headaches, visual changes, nausea, vomiting, or dizziness. There has been no unusual cough, phlegm production, or pleurisy. There has been no change in bowel or bladder habits. She denies unexplained fatigue or unexplained weight loss, bleeding, rash, or fever. A detailed review of systems was otherwise stable.    PAST MEDICAL HISTORY: Past Medical History:  Diagnosis Date  . CMV infection ([place])   . Collagenous colitis   . Family history of breast cancer   . Family history of melanoma   . Family history of pancreatic cancer   . Family history of prostate cancer   . HTN (hypertension)   . Hypercholesterolemia   . Normal nuclear stress test 03/18/2007  . Normal stress echocardiogram 02/03/2010   RBBB, CLINICALLY NEGATIVE FOR ANGINA, NORMAL LV FUNCTION WITH MILD LVH    PAST SURGICAL HISTORY: Past Surgical History:  Procedure Laterality Date  . APPENDECTOMY    . BREAST LUMPECTOMY WITH RADIOACTIVE SEED  LOCALIZATION Right 09/13/2017   Procedure: RIGHT BREAST LUMPECTOMY WITH RADIOACTIVE SEED LOCALIZATION, AND RIGHT BREAST SEED GUIDED  EXCISIONAL BIOPSY;  Surgeon: Rolm Bookbinder, MD;  Location:  [facility];  Service: General;  Laterality: Right;  . CATARACT EXTRACTION, BILATERAL    . CHOLECYSTECTOMY  1972  . ROTATOR CUFF REPAIR Right   . TOTAL ABDOMINAL HYSTERECTOMY W/ BILATERAL SALPINGOOPHORECTOMY      FAMILY HISTORY Family History  Problem Relation Age of Onset  . Parkinson's disease Mother   . Alzheimer's disease Mother   . Prostate cancer Father        'spread outside of prostate'  . Breast cancer Sister 61  . Heart attack Brother   . Rheum arthritis Paternal Aunt   . Pancreatic cancer Cousin        40's/50's  . Anemia Neg Hx   . Arrhythmia Neg Hx   . Asthma Neg Hx   . Clotting disorder Neg Hx   . Fainting Neg Hx   . Heart disease Neg Hx   . Heart failure Neg Hx   . Hyperlipidemia Neg Hx   . Hypertension Neg Hx   Her father had prostate cancer at age 36. Her mother died from parkinson's and alzheimer at age 36. Her parents were immigrants from [place]. She has 2 sisters and 2 brothers. One of her brothers died last year. There is no other known family history of ovarian or breast cancer, but she has essentially no information extended family  GYNECOLOGIC HISTORY:  No LMP recorded. Patient has had a hysterectomy. 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 Stephanie Liu, her husband is a Film/video editor.  He retired in 2017.  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  Their son, Stephanie Liu, is 101 year old and is a GI Physician in [place], [place].  The patient has has 5 grandchildren      ADVANCED DIRECTIVES:    HEALTH MAINTENANCE: Social History   Tobacco Use  . Smoking status: Never Smoker    . Smokeless tobacco: Never Used  Substance Use Topics  . Alcohol use: Yes    Alcohol/week: 1.0 standard drinks    Types: 1 Glasses of wine per week    Comment: Occasional  . Drug use: Never     Colonoscopy: UTD  PAP: n/a  Bone density: UTD with Dr. Joylene Draft   Allergies  Allergen Reactions  . Codeine Hives  . Sulfa Antibiotics Rash    Current Outpatient Medications  Medication Sig Dispense Refill  . aspirin 81 MG EC tablet Take 81 mg by mouth 3 (three) times a week. Swallow whole.    . Biotin 2500 MCG CAPS Take 5,000 mcg by mouth daily.    . budesonide (ENTOCORT EC) 3 MG 24 hr capsule Take 3 mg by mouth every morning.   5  . Coenzyme Q10 (COQ-10) 400 MG CAPS Take 400 mg by mouth daily.    Marland Kitchen ezetimibe (ZETIA) 10 MG tablet Take 10 mg by mouth daily.    . metoprolol succinate (TOPROL-XL) 25 MG 24 hr tablet Take 25 mg by mouth daily.    . Multiple  Vitamins-Minerals (PRESERVISION AREDS PO) Take by mouth daily.    . Probiotic Product (PROBIOTIC DAILY PO) Take by mouth.    . rosuvastatin (CRESTOR) 5 MG tablet Take 2.5 mg by mouth daily.     . tamoxifen (NOLVADEX) 20 MG tablet Take 1 tablet (20 mg total) by mouth daily. 30 tablet 12  . triamterene-hydrochlorothiazide (DYAZIDE) 37.5-25 MG capsule Take 0.5 capsules by mouth daily.     No current facility-administered medications for this visit.     OBJECTIVE: Middle-aged white woman who appears well  Vitals:   03/14/18 1440  BP: (!) 158/80  Pulse: 72  Resp: 18  Temp: 97.8 F (36.6 C)  SpO2: 97%     Body mass index is 27.14 kg/m.   Wt Readings from Last 3 Encounters:  03/14/18 158 lb 1.6 oz (71.7 kg)  10/12/17 156 lb 2 oz (70.8 kg)  10/12/17 155 lb 12.8 oz (70.7 kg)      ECOG FS:0 - Asymptomatic  Sclerae unicteric, pupils round and equal Oropharynx clear and moist No cervical or supraclavicular adenopathy Lungs no rales or rhonchi Heart regular rate and rhythm Abd soft, nontender, positive bowel sounds MSK no focal  spinal tenderness, no upper extremity lymphedema Neuro: nonfocal, well oriented, appropriate affect Breasts: Breast is status post lumpectomy and radiation.  There is no evidence of local recurrence.  The left breast is benign.  Both axillae are benign.  LAB RESULTS:  CMP     Component Value Date/Time   NA 136 08/16/2017 1545   K 4.8 08/16/2017 1545   CL 99 08/16/2017 1545   CO2 30 (H) 08/16/2017 1545   GLUCOSE 98 08/16/2017 1545   BUN 18 08/16/2017 1545   CREATININE 0.84 08/16/2017 1545   CALCIUM 9.8 08/16/2017 1545   PROT 7.4 08/16/2017 1545   ALBUMIN 3.9 08/16/2017 1545   AST 29 08/16/2017 1545   ALT 38 08/16/2017 1545   ALKPHOS 57 08/16/2017 1545   BILITOT 0.3 08/16/2017 1545   GFRNONAA >60 08/16/2017 1545   GFRAA >60 08/16/2017 1545    No results found for: TOTALPROTELP, ALBUMINELP, A1GS, A2GS, BETS, BETA2SER, GAMS, MSPIKE, SPEI  No results found for: KPAFRELGTCHN, LAMBDASER, KAPLAMBRATIO  Lab Results  Component Value Date   WBC 7.3 03/14/2018   NEUTROABS 4.2 03/14/2018   HGB 14.1 03/14/2018   HCT 41.9 03/14/2018   MCV 93.1 03/14/2018   PLT 201 03/14/2018    @LASTCHEMISTRY @  No results found for: LABCA2  No components found for: [id number]  No results for input(s): INR in the last 168 hours.  No results found for: LABCA2  No results found for: DXI338  No results found for: SNK539  No results found for: JQB341  No results found for: CA2729  No components found for: HGQUANT  No results found for: CEA1 / No results found for: CEA1   No results found for: AFPTUMOR  No results found for: CHROMOGRNA  No results found for: PSA1  Appointment on 03/14/2018  Component Date Value Ref Range Status  . WBC 03/14/2018 7.3  3.9 - 10.3 K/uL Final  . RBC 03/14/2018 4.50  3.70 - 5.45 MIL/uL Final  . Hemoglobin 03/14/2018 14.1  11.6 - 15.9 g/dL Final  . HCT 03/14/2018 41.9  34.8 - 46.6 % Final  . MCV 03/14/2018 93.1  79.5 - 101.0 fL Final  . MCH 03/14/2018  31.3  25.1 - 34.0 pg Final  . MCHC 03/14/2018 33.6  31.5 - 36.0 g/dL Final  . RDW 03/14/2018  13.1  11.2 - 14.5 % Final  . Platelets 03/14/2018 201  145 - 400 K/uL Final  . Neutrophils Relative % 03/14/2018 57  % Final  . Neutro Abs 03/14/2018 4.2  1.5 - 6.5 K/uL Final  . Lymphocytes Relative 03/14/2018 31  % Final  . Lymphs Abs 03/14/2018 2.2  0.9 - 3.3 K/uL Final  . Monocytes Relative 03/14/2018 8  % Final  . Monocytes Absolute 03/14/2018 0.6  0.1 - 0.9 K/uL Final  . Eosinophils Relative 03/14/2018 3  % Final  . Eosinophils Absolute 03/14/2018 0.2  0.0 - 0.5 K/uL Final  . Basophils Relative 03/14/2018 1  % Final  . Basophils Absolute 03/14/2018 0.1  0.0 - 0.1 K/uL Final   Performed at [facility], [place] [address] [place], [place] [address]    (this displays the last labs from the last 3 days)  No results found for: TOTALPROTELP, ALBUMINELP, A1GS, A2GS, BETS, BETA2SER, GAMS, MSPIKE, SPEI (this displays SPEP labs)  No results found for: KPAFRELGTCHN, LAMBDASER, KAPLAMBRATIO (kappa/lambda light chains)  No results found for: HGBA, HGBA2QUANT, HGBFQUANT, HGBSQUAN (Hemoglobinopathy evaluation)   No results found for: LDH  No results found for: IRON, TIBC, IRONPCTSAT (Iron and TIBC)  No results found for: FERRITIN  Urinalysis No results found for: COLORURINE, APPEARANCEUR, LABSPEC, PHURINE, GLUCOSEU, HGBUR, BILIRUBINUR, KETONESUR, PROTEINUR, UROBILINOGEN, NITRITE, LEUKOCYTESUR   STUDIES: No results found.  ELIGIBLE FOR AVAILABLE RESEARCH PROTOCOL: no  ASSESSMENT: 77 y.o.-year-old [place] woman status post right breast upper outer quadrant biopsy 08/02/2017 for a clinical T1c N0, stage IA invasive ductal carcinoma, grade 1, estrogen and progesterone receptor positive, with an MIB-105% and no HER-2 amplification.  (a) MRI biopsy of second area in the right breast on 08/26/2017 showed ([id number]): a complex sclerosing lesion   (1) status post right  lumpectomy without sentinel lymph node sampling 09/13/2017 for a pT1b pNX, stage Ia invasive ductal carcinoma, with negative margins  (2) Oncotype DX score of 17 predicts a risk of outside the breast recurrence over the next 9 years of 5% if the patient's only systemic therapy is antiestrogens for 5 years.  It also predicts no benefit from chemotherapy.  (3) adjuvant radiation 10/25/2017 - 11/22/2017 Site/dose:    1. Right Breast / 40.05 Gy in 15 fractions 2. Right Breast Boost / 10 Gy in 5 fractions  (4) tamoxifen started 11/19/2017  (5) genetics testing 11/29/2017 through [facility] Common Hereditary Cancer Panel + melanoma panel showed: no deleterious mutations in APC, ATM, AXIN2, BAP1, BARD1, BMPR1A, BRCA1, BRCA2, CDH1, CDK4, CDKN2A (p14ARF), CDKN2A (p16INK4a), CHEK2,CTNNA1, DICER1, EPCAM*, GREM1*, KIT, MEN1, MLH1, MSH2, MSH3, MSH6, MUTYH, NBN, NF1, PALB2, PDGFRA, PMS2, POLD1, POLE, POT1, PTEN, RAD50, RAD51C, RAD51D, RB1, SDHB, SDHC, SDHD, SMAD4, SMARCA4, STK11, TP53, TSC1, TSC2, VHL. The following genes were evaluated for sequence changes only: HOXB13*, MITF*, NTHL1*, SDHA  (a) A variant of uncertain significance (VUS) in  BRIP1 was detected c.1655T>C (p.Ile552Thr).   PLAN: Stephanie Liu is now 6 months out from definitive surgery for breast cancer with no evidence of disease activity.  She is tolerating tamoxifen generally well and the plan will be to continue that a total of 5 years.  She wondered if she should take a baby aspirin because of the tamoxifen.  We discussed the fact that tamoxifen increases the risk of venous thromboembolism but it is not clear that it has any effect on arterial thromboembolism.  Aspirin however primarily works on arterial clots.  Accordingly if Stephanie Liu wants to take the  aspirin for other indications there is no problem but if she does not want to take it there is no indication as far as the tamoxifen is concerned.  As far as her diarrhea is concerned I wonder if she would  benefit from [place] and I have placed that prescription in for her.  In terms of her sister's problems I recommended Stephanie Liu read "the 36-hour day".  Stephanie Liu is having more vaginal issues than before.  I am writing for [place], which we discussed at length, and she will also come for our pelvic health physical therapy teaching  Finally I think Stephanie Liu would benefit from our finding your new normal group and gave her information regarding that.  She will see me again in April 2020.  Assuming all goes well we will probably start seeing her on a yearly basis from that point   Stephanie Liu, Virgie Dad, MD  03/14/18 2:57 PM Medical Oncology and Hematology [facility] [place], [place] [address] Tel. [phone]    Fax. [phone]  Alice Rieger, am acting as scribe for Chauncey Cruel MD.  I, Lurline Del MD, have reviewed the above documentation for accuracy and completeness, and I agree with the above.

## 2018-03-14 ENCOUNTER — Telehealth: Payer: Self-pay | Admitting: Oncology

## 2018-03-14 ENCOUNTER — Inpatient Hospital Stay: Payer: PPO | Attending: Oncology | Admitting: Oncology

## 2018-03-14 ENCOUNTER — Inpatient Hospital Stay: Payer: PPO

## 2018-03-14 VITALS — BP 158/80 | HR 72 | Temp 97.8°F | Resp 18 | Ht 64.0 in | Wt 158.1 lb

## 2018-03-14 DIAGNOSIS — C50411 Malignant neoplasm of upper-outer quadrant of right female breast: Secondary | ICD-10-CM

## 2018-03-14 DIAGNOSIS — Z79810 Long term (current) use of selective estrogen receptor modulators (SERMs): Secondary | ICD-10-CM | POA: Insufficient documentation

## 2018-03-14 DIAGNOSIS — Z170 Estrogen receptor positive status [ER+]: Secondary | ICD-10-CM | POA: Diagnosis not present

## 2018-03-14 LAB — COMPREHENSIVE METABOLIC PANEL
ALT: 20 U/L (ref 0–44)
AST: 25 U/L (ref 15–41)
Albumin: 3.6 g/dL (ref 3.5–5.0)
Alkaline Phosphatase: 62 U/L (ref 38–126)
Anion gap: 8 (ref 5–15)
BUN: 15 mg/dL (ref 8–23)
CO2: 28 mmol/L (ref 22–32)
Calcium: 9 mg/dL (ref 8.9–10.3)
Chloride: 101 mmol/L (ref 98–111)
Creatinine, Ser: 0.76 mg/dL (ref 0.44–1.00)
GFR calc Af Amer: >60 mL/min (ref >60)
GFR calc non Af Amer: >60 mL/min (ref >60)
Glucose, Bld: 104 mg/dL — ABNORMAL HIGH (ref 70–99)
Potassium: 3.8 mmol/L (ref 3.5–5.1)
Sodium: 137 mmol/L (ref 135–145)
Total Bilirubin: 0.2 mg/dL — ABNORMAL LOW (ref 0.3–1.2)
Total Protein: 6.8 g/dL (ref 6.5–8.1)

## 2018-03-14 LAB — CBC WITH DIFFERENTIAL/PLATELET
Basophils Absolute: 0.1 10*3/uL (ref 0.0–0.1)
Basophils Relative: 1 %
Eosinophils Absolute: 0.2 10*3/uL (ref 0.0–0.5)
Eosinophils Relative: 3 %
HCT: 41.9 % (ref 34.8–46.6)
Hemoglobin: 14.1 g/dL (ref 11.6–15.9)
Lymphocytes Relative: 31 %
Lymphs Abs: 2.2 10*3/uL (ref 0.9–3.3)
MCH: 31.3 pg (ref 25.1–34.0)
MCHC: 33.6 g/dL (ref 31.5–36.0)
MCV: 93.1 fL (ref 79.5–101.0)
Monocytes Absolute: 0.6 10*3/uL (ref 0.1–0.9)
Monocytes Relative: 8 %
Neutro Abs: 4.2 10*3/uL (ref 1.5–6.5)
Neutrophils Relative %: 57 %
Platelets: 201 10*3/uL (ref 145–400)
RBC: 4.5 MIL/uL (ref 3.70–5.45)
RDW: 13.1 % (ref 11.2–14.5)
WBC: 7.3 10*3/uL (ref 3.9–10.3)

## 2018-03-14 MED ORDER — CHOLESTYRAMINE 4 G PO PACK: 4.0000 g | PACK | Freq: Every day | ORAL | 12 refills | Status: DC | PRN

## 2018-03-14 MED ORDER — ESTRADIOL 2 MG VA RING: 2.0000 mg | VAGINAL_RING | VAGINAL | 12 refills | Status: DC

## 2018-03-14 MED FILL — ESTRING 2 MG VAGINAL RING: 2 | 90 days supply | Qty: 1 | Fill #0

## 2018-03-14 MED FILL — CHOLESTYRAMINE PACKET: 4 | 30 days supply | Qty: 30 | Fill #0

## 2018-03-14 NOTE — Telephone Encounter (Signed)
Gave pt avs and calendar  °

## 2018-03-21 MED FILL — TAMOXIFEN CITRATE 20 MG TAB: 20 | 30 days supply | Qty: 30 | Fill #3

## 2018-03-21 MED FILL — TRIAMTERENE/HCTZ 37.5/25 TB: 37.5-25 | 90 days supply | Qty: 45 | Fill #3

## 2018-03-21 MED FILL — METOPROLOL SUCCINATE ER 25: 25 | 90 days supply | Qty: 90 | Fill #2

## 2018-03-23 MED FILL — BUDESONIDE 3 MG CPEP: 3 | 30 days supply | Qty: 90 | Fill #2

## 2018-04-18 ENCOUNTER — Encounter: Payer: Self-pay | Admitting: Physical Therapy

## 2018-04-18 ENCOUNTER — Other Ambulatory Visit: Payer: Self-pay

## 2018-04-18 ENCOUNTER — Ambulatory Visit: Payer: PPO | Attending: Oncology | Admitting: Physical Therapy

## 2018-04-18 DIAGNOSIS — R252 Cramp and spasm: Secondary | ICD-10-CM

## 2018-04-18 DIAGNOSIS — Z170 Estrogen receptor positive status [ER+]: Secondary | ICD-10-CM | POA: Diagnosis not present

## 2018-04-18 DIAGNOSIS — C50911 Malignant neoplasm of unspecified site of right female breast: Secondary | ICD-10-CM | POA: Diagnosis not present

## 2018-04-18 NOTE — Patient Instructions (Signed)
Moisturizers . They are used in the vagina to hydrate the mucous membrane that make up the vaginal canal. . Designed to keep a more normal acid balance (ph) . Once placed in the vagina, it will last between two to three days.  . Use 2-3 times per week at bedtime and last longer than 60 min. . Ingredients to avoid is glycerin and fragrance, can increase chance of infection . Should not be used just before sex due to causing irritation . Most are gels administered either in a tampon-shaped applicator or as a vaginal suppository. They are non-hormonal.   Types of Moisturizers . Samul Dada- drug store . Vitamin E vaginal suppositories- Whole foods, Amazon . Moist Again . Coconut oil- can break down condoms . Julva- (Do no use if on Tamoxifen) amazon . Yes moisturizer- amazon . NeuEve Silk , NeuEve Silver for menopausal or over 65 (if have severe vaginal atrophy or cancer treatments use NeuEve Silk for  1 month than move to [employer])- [employer], MapleFlower.dk . Olive and Bee intimate cream- www.oliveandbee.com.au . Mae vaginal moisturizer- Amazon  Creams to use externally on the Vulva area  Albertson's (good for for cancer patients that had radiation to the area)- [place] or [employer].FlyingBasics.com.br  V-magic cream - amazon  Julva-amazon  Vital "V Wild Yam salve ( help moisturize and help with thinning vulvar area, does have [place]berta by Damiva labial moisturizer ([place],    Things to avoid in the vaginal area . Do not use things to irritate the vulvar area . No lotions just specialized creams for the vulva area- Neogyn, V-magic, No soaps; can use Aveeno or Calendula cleanser if needed. Must be gentle . No deodorants . No douches . Good to sleep without underwear to let the vaginal area to air out . No scrubbing: spread the lips to let warm water rinse over labias and pat dry   Lubrication . Used  for intercourse to reduce friction . Avoid ones that have glycerin, warming gels, tingling gels, icing or cooling gel, scented . Avoid parabens due to a preservative similar to female sex hormone . May need to be reapplied once or several times during sexual activity . Can be applied to both partners genitals prior to vaginal penetration to minimize friction or irritation . Prevent irritation and mucosal tears that cause post coital pain and increased the risk of vaginal and urinary tract infections . Oil-based lubricants cannot be used with condoms due to breaking them down.  Least likely to irritate vaginal tissue.  . Plant based-lubes are safe . Silicone-based lubrication are thicker and last long and used for post-menopausal women  Vaginal Lubricators Here is a list of some suggested lubricators you can use for intercourse. Use the most hypoallergenic product.  You can place on you or your partner.   Slippery Stuff  Sylk or Sliquid Natural H2O ( good  if frequent UTI's)  Blossom Organics (www.blossom-organics.com)  Luvena   Coconut oil  PJur Woman Nude- water based lubricant, amazon  Uberlube- Amazon  Aloe Vera  Yes lubricant- [employer] Platinum-Silicone, Target, Walgreens  Olive and Bee intimate cream-  www.oliveandbee.com.au Things to avoid in lubricants are glycerin, warming gels, tingling gels, icing or cooling  gels, and scented gels.  Also avoid Vaseline. KY jelly, Replens, and Astroglide kills good bacteria(lactobacilli)  Things to avoid in the vaginal area . Do not use things to irritate the  vulvar area . No lotions- see below . No soaps; can use Aveeno or Calendula cleanser if needed. Must be gentle . No deodorants . No douches . Good to sleep without underwear to let the vaginal area to air out . No scrubbing: spread the lips to let warm water rinse over labias and pat dry  Creams that can be used on the [place] Releveum or [facility] [address] [place], [place] [address] Phone # [phone] Fax [phone]

## 2018-04-18 NOTE — Therapy (Signed)
[facility] [address] [place], [place], [address] Phone: [phone]   Fax:  [phone]  Physical Therapy Evaluation/Discharge  Patient Details  Name: CITLALI GAUTNEY MRN: [id number] Date of Birth: 06-08-1941 Referring Provider (PT): Dr. Lurline Del   Encounter Date: 04/18/2018  PT End of Session - 04/18/18 1525    Visit Number  1    PT Start Time  8329    PT Stop Time  1525    PT Time Calculation (min)  40 min    Activity Tolerance  Patient tolerated treatment well;No increased pain    Behavior During Therapy  WFL for tasks assessed/performed       Past Medical History:  Diagnosis Date  . CMV infection ([place])   . Collagenous colitis   . Family history of breast cancer   . Family history of melanoma   . Family history of pancreatic cancer   . Family history of prostate cancer   . HTN (hypertension)   . Hypercholesterolemia   . Normal nuclear stress test 03/18/2007  . Normal stress echocardiogram 02/03/2010   RBBB, CLINICALLY NEGATIVE FOR ANGINA, NORMAL LV FUNCTION WITH MILD LVH    Past Surgical History:  Procedure Laterality Date  . APPENDECTOMY    . BREAST LUMPECTOMY WITH RADIOACTIVE SEED LOCALIZATION Right 09/13/2017   Procedure: RIGHT BREAST LUMPECTOMY WITH RADIOACTIVE SEED LOCALIZATION, AND RIGHT BREAST SEED GUIDED  EXCISIONAL BIOPSY;  Surgeon: Rolm Bookbinder, MD;  Location: [facility];  Service: General;  Laterality: Right;  . CATARACT EXTRACTION, BILATERAL    . CHOLECYSTECTOMY  1972  . ROTATOR CUFF REPAIR Right   . TOTAL ABDOMINAL HYSTERECTOMY W/ BILATERAL SALPINGOOPHORECTOMY      There were no vitals filed for this visit.   Subjective Assessment - 04/18/18 1452    Subjective  Patient was having issues with vaginal dryness during July and August. I tried coconut.     Patient Stated Goals  education on vaginal dryness    Currently in Pain?  No/denies    Multiple Pain  Sites  No         [facility] PT Assessment - 04/18/18 0001      Assessment   Medical Diagnosis  C50.411, Z17.OMalignant neoplasm of upper-outer quadrant of right breast in female, estrogen receptor positive    Referring Provider (PT)  Dr. Sarajane Jews Magrinat    Onset Date/Surgical Date  08/02/17    Prior Therapy  none      Precautions   Precautions  Other (comment)    Precaution Comments  cancer      Restrictions   Weight Bearing Restrictions  No      Balance Screen   Has the patient fallen in the past 6 months  No    Has the patient had a decrease in activity level because of a fear of falling?   No    Is the patient reluctant to leave their home because of a fear of falling?   No      Prior Function   Level of Independence  Independent    Leisure  personal trainer, classes, kayaking, yoga, hiking      Cognition   Overall Cognitive Status  Within Functional Limits for tasks assessed      Posture/Postural Control   Posture/Postural Control  No significant limitations      ROM / Strength   AROM / PROM / Strength  AROM;PROM;Strength      AROM   Overall  AROM Comments  lumbar ROM is full      Strength   Strength Assessment Site  Hip    Right Hip ABduction  4/5    Left Hip ABduction  4/5      Palpation   SI assessment   ASIS is equal                Objective measurements completed on examination: See above findings.    Pelvic Floor Special Questions - 04/18/18 0001    Currently Sexually Active  Yes    Is this Painful  No    Urinary Leakage  No    Fecal incontinence  No    Falling out feeling (prolapse)  No               PT Education - 04/18/18 1524    Education Details  information on vaginal moisturizers and lubricants, information on vaginal health    Person(s) Educated  Patient    Methods  Explanation;Handout    Comprehension  Verbalized understanding          PT Long Term Goals - 04/18/18 1538      PT LONG TERM GOAL #1   Title   independent with the knowledge of vaginal lubricants and moisturizers for the vaginal area and good pelvic floor health    Time  1    Period  Days    Status  Achieved    Target Date  04/18/18             Plan - 04/18/18 1526    Clinical Impression Statement  Patient is a 77 year old female s/p beast cancer with chemotherapy/radiation 10/25/2017-11/22/2017. Patient is presently taking Tamoxifen since 11/19/2017. Patient reports she was having vaginal dryness in July and August. She has been using olive oil for moisturizers and lubricants. Since then she is not having issues with vaginal dryness. 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  Patient has full lumbar ROM.  Patient reports no urinary leakage or fecal leakage. Patient is not having pain with intercourse. Patient was educated on how to use lubricants and mosturizers for the pelvic floor to improve pelvic floor health    History and Personal Factors relevant to plan of care:  Right breast cancer; radiation 10/25/2017-11/22/2017; Total hysterectomy    Clinical Presentation  Stable    Clinical Presentation due to:  stable condition    Clinical Decision Making  Low    Rehab Potential  Excellent    PT Frequency  One time visit    PT Duration  Other (comment)   1 session   PT Treatment/Interventions  Therapeutic activities;Therapeutic exercise;Patient/family education    PT Next Visit Plan  Discharge to HEP for vaginal moisturizers and lubricants    PT Home Exercise Plan  current HEP    Consulted and Agree with Plan of Care  Patient       Patient will benefit from skilled therapeutic intervention in order to improve the following deficits and impairments:  Decreased strength  Visit Diagnosis: Cramp and spasm - Plan: PT plan of care cert/re-cert  Malignant neoplasm of right breast in female, estrogen receptor positive, unspecified site of breast ([place]) - Plan: PT plan of  care cert/re-cert     Problem List Patient Active Problem List   Diagnosis Date Noted  . Genetic testing 12/15/2017  . Family history of breast cancer   . Family history of  pancreatic cancer   . Family history of prostate cancer   . Family history of melanoma   . Malignant neoplasm of upper-outer quadrant of right breast in female, estrogen receptor positive ([place]) 08/16/2017  . HTN (hypertension) 08/24/2011  . Hyperlipidemia 08/24/2011    Earlie Counts, PT 04/18/18 3:42 PM   [facility] [address] [place], [place], [address] Phone: [phone]   Fax:  [phone]  Name: YULIZA CARA MRN: [id number] Date of Birth: 1940-11-10 PHYSICAL THERAPY DISCHARGE SUMMARY  Visits from Start of Care: 1  Current functional level related to goals / functional outcomes: See above.    Remaining deficits: See above   Education / Equipment: HEP Plan: Patient agrees to discharge.  Patient goals were met. Patient is being discharged due to meeting the stated rehab goals. Thank you for the referral. Earlie Counts, PT 04/18/18 3:42 PM   ?????

## 2018-04-19 MED FILL — TAMOXIFEN CITRATE 20 MG TAB: 20 | 30 days supply | Qty: 30 | Fill #4

## 2018-04-19 MED FILL — ROSUVASTATIN CALCIUM 5 MG T: 5 | 90 days supply | Qty: 90 | Fill #1

## 2018-04-27 MED FILL — BUDESONIDE 3 MG CPEP: 3 | 30 days supply | Qty: 90 | Fill #3

## 2018-05-10 ENCOUNTER — Encounter: Payer: PPO | Admitting: Adult Health

## 2018-05-11 MED FILL — EZETIMIBE 10 MG TABS: 10 | 90 days supply | Qty: 90 | Fill #0

## 2018-05-12 MED FILL — TAMOXIFEN CITRATE 20 MG TAB: 20 | 30 days supply | Qty: 30 | Fill #5

## 2018-06-15 ENCOUNTER — Telehealth: Payer: Self-pay

## 2018-06-15 NOTE — Telephone Encounter (Signed)
Spoke with patient reminding of SCP visit with NP on 06/24/18 at 11 am.  Patient says she with come to appt.

## 2018-06-21 DIAGNOSIS — E7849 Other hyperlipidemia: Secondary | ICD-10-CM | POA: Diagnosis not present

## 2018-06-21 DIAGNOSIS — R82998 Other abnormal findings in urine: Secondary | ICD-10-CM | POA: Diagnosis not present

## 2018-06-21 DIAGNOSIS — I10 Essential (primary) hypertension: Secondary | ICD-10-CM | POA: Diagnosis not present

## 2018-06-21 MED FILL — TAMOXIFEN CITRATE 20 MG TAB: 20 | 30 days supply | Qty: 30 | Fill #6

## 2018-06-23 MED FILL — BUDESONIDE 3 MG CPEP: 3 | 30 days supply | Qty: 90 | Fill #4

## 2018-06-24 ENCOUNTER — Telehealth: Payer: Self-pay | Admitting: Adult Health

## 2018-06-24 ENCOUNTER — Encounter: Payer: Self-pay | Admitting: Adult Health

## 2018-06-24 ENCOUNTER — Inpatient Hospital Stay: Payer: PPO | Attending: Oncology | Admitting: Adult Health

## 2018-06-24 VITALS — BP 148/81 | HR 71 | Temp 97.5°F | Resp 17 | Ht 64.0 in | Wt 162.6 lb

## 2018-06-24 DIAGNOSIS — Z170 Estrogen receptor positive status [ER+]: Secondary | ICD-10-CM | POA: Diagnosis not present

## 2018-06-24 DIAGNOSIS — Z803 Family history of malignant neoplasm of breast: Secondary | ICD-10-CM | POA: Diagnosis not present

## 2018-06-24 DIAGNOSIS — Z9079 Acquired absence of other genital organ(s): Secondary | ICD-10-CM | POA: Diagnosis not present

## 2018-06-24 DIAGNOSIS — Z90722 Acquired absence of ovaries, bilateral: Secondary | ICD-10-CM | POA: Diagnosis not present

## 2018-06-24 DIAGNOSIS — Z808 Family history of malignant neoplasm of other organs or systems: Secondary | ICD-10-CM

## 2018-06-24 DIAGNOSIS — Z923 Personal history of irradiation: Secondary | ICD-10-CM

## 2018-06-24 DIAGNOSIS — Z8042 Family history of malignant neoplasm of prostate: Secondary | ICD-10-CM

## 2018-06-24 DIAGNOSIS — Z800 Family history of malignant neoplasm of digestive organs: Secondary | ICD-10-CM | POA: Diagnosis not present

## 2018-06-24 DIAGNOSIS — Z79810 Long term (current) use of selective estrogen receptor modulators (SERMs): Secondary | ICD-10-CM | POA: Diagnosis not present

## 2018-06-24 DIAGNOSIS — Z79899 Other long term (current) drug therapy: Secondary | ICD-10-CM | POA: Diagnosis not present

## 2018-06-24 DIAGNOSIS — C50411 Malignant neoplasm of upper-outer quadrant of right female breast: Secondary | ICD-10-CM

## 2018-06-24 DIAGNOSIS — Z90710 Acquired absence of both cervix and uterus: Secondary | ICD-10-CM

## 2018-06-24 NOTE — Progress Notes (Signed)
CLINIC:  Survivorship   REASON FOR VISIT:  Routine follow-up post-treatment for a recent history of breast cancer.  BRIEF ONCOLOGIC HISTORY:    Malignant neoplasm of upper-outer quadrant of right breast in female, estrogen receptor positive ([place])   08/02/2017 Initial Diagnosis    status post right breast upper outer quadrant biopsy for a clinical T1c N0, stage IA invasive ductal carcinoma, grade 1, estrogen and progesterone receptor positive, with an MIB-5% and no HER-2 amplification.             (a) MRI biopsy of second area in the right breast on 08/26/2017 showed ([id number]): a complex sclerosing lesion      09/13/2017 Surgery    status post right lumpectomy without sentinel lymph node sampling 09/13/2017 for a pT1b pNX, stage Ia invasive ductal carcinoma, with negative margins    09/13/2017 Oncotype testing     17 predicts a risk of outside the breast recurrence over the next 9 years of 5% if the patient's only systemic therapy is antiestrogens for 5 years.  It also predicts no benefit from chemotherapy.    10/25/2017 - 11/22/2017 Radiation Therapy    adjuvant radiation 10/25/2017 - 11/22/2017 Site/dose: 1. Right Breast / 40.05 Gy in 15 fractions 2. Right Breast Boost / 10 Gy in 5 fractions    11/29/2017 Genetic Testing    The Common Heredtiary [place]ers Panel + melanoma panel was ordered (51 genes). The following genes were evaluated for sequence changes and exonic deletions/duplications:APC, ATM, AXIN2, BAP1, BARD1, BMPR1A, BRCA1, BRCA2, BRIP1, CDH1, CDK4, CDKN2A (p14ARF), CDKN2A (p16INK4a), CHEK2,CTNNA1, DICER1, EPCAM*, GREM1*, KIT, MEN1, MLH1, MSH2, MSH3, MSH6, MUTYH, NBN, NF1, PALB2, PDGFRA, PMS2, POLD1, POLE, POT1, PTEN, RAD50, RAD51C, RAD51D, RB1, SDHB, SDHC, SDHD, SMAD4, SMARCA4, STK11, TP53, TSC1, TSC2, VHL. The following genes were evaluated for sequence changes only: HOXB13*, MITF*, NTHL1*, SDHA  Results: No pathogenic variants identified.  A variant of uncertain  significance in the gene BRIP1 was detected c.1655T>C (p.Ile552Thr).  The date of this test report is 11/29/2017.     11/2017 -  Anti-estrogen oral therapy    Tamoxifen daily     INTERVAL HISTORY:  Stephanie Liu presents to the [facility] today for our initial meeting to review her survivorship care plan detailing her treatment course for breast cancer, as well as monitoring long-term side effects of that treatment, education regarding health maintenance, screening, and overall wellness and health promotion.     Overall, Stephanie Liu reports feeling quite well.  She is taking tamoxifen daily and notes an increased appetite and slight increase in dry skin.  She denies vaginal discharge.  She says her vaginal dryness is improved.  She notes the ring she was given by Dr. Jana Hakim wasn't pleasant.  She is not using this any longer.  She denies hot flashes. She has not had any arthralgias.  She is exercising.      REVIEW OF SYSTEMS:  Review of Systems  Constitutional: Negative for appetite change, chills, fatigue, fever and unexpected weight change.  HENT:   Negative for hearing loss, lump/mass and trouble swallowing.   Eyes: Negative for eye problems and icterus.  Respiratory: Negative for chest tightness, cough and shortness of breath.   Cardiovascular: Negative for chest pain, leg swelling and palpitations.  Gastrointestinal: Negative for abdominal distention, abdominal pain, constipation, diarrhea, nausea and vomiting.  Endocrine: Negative for hot flashes.  Skin: Negative for itching and rash.  Neurological: Negative for dizziness, extremity weakness, headaches and numbness.  Hematological: Negative for  adenopathy. Does not bruise/bleed easily.  Psychiatric/Behavioral: Negative for depression. The patient is not nervous/anxious.   Breast: Denies any new nodularity, masses, tenderness, nipple changes, or nipple discharge.      ONCOLOGY TREATMENT TEAM:  1. Surgeon:  Dr.  Donne Hazel at [facility] 2. Medical Oncologist: Dr. Jana Hakim  3. Radiation Oncologist: Dr. Sondra Come    PAST MEDICAL/SURGICAL HISTORY:  Past Medical History:  Diagnosis Date  . CMV infection ([place])   . Collagenous colitis   . Family history of breast cancer   . Family history of melanoma   . Family history of pancreatic cancer   . Family history of prostate cancer   . HTN (hypertension)   . Hypercholesterolemia   . Normal nuclear stress test 03/18/2007  . Normal stress echocardiogram 02/03/2010   RBBB, CLINICALLY NEGATIVE FOR ANGINA, NORMAL LV FUNCTION WITH MILD LVH   Past Surgical History:  Procedure Laterality Date  . APPENDECTOMY    . BREAST LUMPECTOMY WITH RADIOACTIVE SEED LOCALIZATION Right 09/13/2017   Procedure: RIGHT BREAST LUMPECTOMY WITH RADIOACTIVE SEED LOCALIZATION, AND RIGHT BREAST SEED GUIDED  EXCISIONAL BIOPSY;  Surgeon: Rolm Bookbinder, MD;  Location: [facility];  Service: General;  Laterality: Right;  . CATARACT EXTRACTION, BILATERAL    . CHOLECYSTECTOMY  1972  . ROTATOR CUFF REPAIR Right   . TOTAL ABDOMINAL HYSTERECTOMY W/ BILATERAL SALPINGOOPHORECTOMY       ALLERGIES:  Allergies  Allergen Reactions  . Codeine Hives  . Sulfa Antibiotics Rash     CURRENT MEDICATIONS:  Outpatient Encounter Medications as of 06/24/2018  Medication Sig  . Biotin 2500 MCG CAPS Take 5,000 mcg by mouth daily.  . budesonide (ENTOCORT EC) 3 MG 24 hr capsule Take by mouth daily.  . Cholecalciferol (VITAMIN D3 PO) Take by mouth.  . cholestyramine (QUESTRAN) 4 g packet Take 1 packet (4 g total) by mouth daily as needed.  . Coenzyme Q10 (COQ-10) 400 MG CAPS Take 400 mg by mouth daily.  Marland Kitchen estradiol (ESTRING) 2 MG vaginal ring Place 2 mg vaginally every 3 (three) months. follow package directions  . ezetimibe (ZETIA) 10 MG tablet Take 10 mg by mouth daily.  . metoprolol succinate (TOPROL-XL) 25 MG 24 hr tablet Take 25 mg by mouth daily.  . Probiotic Product  (PROBIOTIC DAILY PO) Take by mouth.  . rosuvastatin (CRESTOR) 5 MG tablet Take 2.5 mg by mouth daily.   . tamoxifen (NOLVADEX) 20 MG tablet Take 1 tablet (20 mg total) by mouth daily.  Marland Kitchen triamterene-hydrochlorothiazide (DYAZIDE) 37.5-25 MG capsule Take 0.5 capsules by mouth daily.   No facility-administered encounter medications on file as of 06/24/2018.      ONCOLOGIC FAMILY HISTORY:  Family History  Problem Relation Age of Onset  . Parkinson's disease Mother   . Alzheimer's disease Mother   . Prostate cancer Father        'spread outside of prostate'  . Breast cancer Sister 12  . Heart attack Brother   . Rheum arthritis Paternal Aunt   . Pancreatic cancer Cousin        40's/50's  . Anemia Neg Hx   . Arrhythmia Neg Hx   . Asthma Neg Hx   . Clotting disorder Neg Hx   . Fainting Neg Hx   . Heart disease Neg Hx   . Heart failure Neg Hx   . Hyperlipidemia Neg Hx   . Hypertension Neg Hx      GENETIC COUNSELING/TESTING: negative  SOCIAL HISTORY:  Social History  Socioeconomic History  . Marital status: Married    Spouse name: Not on file  . Number of children: Not on file  . Years of education: Not on file  . Highest education level: Not on file  Occupational History  . Not on file  Social Needs  . Financial resource strain: Not on file  . Food insecurity:    Worry: Not on file    Inability: Not on file  . Transportation needs:    Medical: Not on file    Non-medical: Not on file  Tobacco Use  . Smoking status: Never Smoker  . Smokeless tobacco: Never Used  Substance and Sexual Activity  . Alcohol use: Yes    Alcohol/week: 1.0 standard drinks    Types: 1 Glasses of wine per week    Comment: Occasional  . Drug use: Never  . Sexual activity: Not on file  Lifestyle  . Physical activity:    Days per week: Not on file    Minutes per session: Not on file  . Stress: Not on file  Relationships  . Social connections:    Talks on phone: Not on file    Gets  together: Not on file    Attends religious service: Not on file    Active member of club or organization: Not on file    Attends meetings of clubs or organizations: Not on file    Relationship status: Not on file  . Intimate partner violence:    Fear of current or ex partner: Not on file    Emotionally abused: Not on file    Physically abused: Not on file    Forced sexual activity: Not on file  Other Topics Concern  . Not on file  Social History Narrative  . Not on file      PHYSICAL EXAMINATION:  Vital Signs:   Vitals:   06/24/18 1040  BP: (!) 148/81  Pulse: 71  Resp: 17  Temp: (!) 97.5 F (36.4 C)  SpO2: 96%   Filed Weights   06/24/18 1040  Weight: 162 lb 9.6 oz (73.8 kg)   General: Well-nourished, well-appearing female in no acute distress.  She is unaccompanied today.   HEENT: Head is normocephalic.  Pupils equal and reactive to light. Conjunctivae clear without exudate.  Sclerae anicteric. Oral mucosa is pink, moist.  Oropharynx is pink without lesions or erythema.  Lymph: No cervical, supraclavicular, or infraclavicular lymphadenopathy noted on palpation.  Cardiovascular: Regular rate and rhythm.Marland Kitchen Respiratory: Clear to auscultation bilaterally. Chest expansion symmetric; breathing non-labored.  Breasts: right breast s/p lumpectomy and radiation, no sign of local recurrence, left breast benign GI: Abdomen soft and round; non-tender, non-distended. Bowel sounds normoactive.  GU: Deferred.  Neuro: No focal deficits. Steady gait.  Psych: Mood and affect normal and appropriate for situation.  Extremities: No edema. MSK: No focal spinal tenderness to palpation.  Full range of motion in bilateral upper extremities Skin: Warm and dry.  LABORATORY DATA:  None for this visit.  DIAGNOSTIC IMAGING:  None for this visit.      ASSESSMENT AND PLAN:  Stephanie Liu is a pleasant 78 y.o. female with Stage IA right breast invasive ductal carcinoma, ER+/PR+/HER2-,  diagnosed in 07/2017, treated with lumpectomy, adjuvant radiation therapy, and anti-estrogen therapy with Tamoxifen beginning in 11/2017.  She presents to the Survivorship Clinic for our initial meeting and routine follow-up post-completion of treatment for breast cancer.    1. Stage IA right breast cancer:  Stephanie Liu is continuing  to recover from definitive treatment for breast cancer. She will follow-up with her medical oncologist, Dr. Jana Hakim in 08/2018 with history and physical exam per surveillance protocol.  She will continue her anti-estrogen therapy with Tamoxifen. Thus far, she is tolerating the Tamoxifen well, with minimal side effects.Today, a comprehensive survivorship care plan and treatment summary was reviewed with the patient today detailing her breast cancer diagnosis, treatment course, potential late/long-term effects of treatment, appropriate follow-up care with recommendations for the future, and patient education resources.  A copy of this summary, along with a letter will be sent to the patient's primary care provider via mail/fax/In Basket message after today's visit.    2. Bone health:  Given Stephanie Liu's age/history of breast cancer, she is at risk for bone demineralization.  She let me know that she has normal bone density testing, and we also reviewed that Tamoxifen can help strengthen the bones.  She was given education on specific activities to promote bone health.  3. Cancer screening:  Due to Stephanie Liu's history and her age, she should receive screening for skin cancers, colon cancer.  The information and recommendations are listed on the patient's comprehensive care plan/treatment summary and were reviewed in detail with the patient.    4. Health maintenance and wellness promotion: Stephanie Liu was encouraged to consume 5-7 servings of fruits and vegetables per day. We reviewed the "Nutrition Rainbow" handout, as well as the handout "Take Control of Your Health and  Reduce Your Cancer Risk" from the [facility].  She was also encouraged to engage in moderate to vigorous exercise for 30 minutes per day most days of the week. We discussed the [employer] fitness program, which is designed for cancer survivors to help them become more physically fit after cancer treatments.  She was instructed to limit her alcohol consumption and continue to abstain from tobacco use.     5. Support services/counseling: It is not uncommon for this period of the patient's cancer care trajectory to be one of many emotions and stressors.  We discussed an opportunity for her to participate in the next session of [facility] ("Finding Your New Normal") support group series designed for patients after they have completed treatment.   Stephanie Liu was encouraged to take advantage of our many other support services programs, support groups, and/or counseling in coping with her new life as a cancer survivor after completing anti-cancer treatment.  She was offered support today through active listening and expressive supportive counseling.  She was given information regarding our available services and encouraged to contact me with any questions or for help enrolling in any of our support group/programs.    Dispo:   -Return to cancer center in 08/2018 for f/u with Dr. Jana Hakim -Mammogram due in 07/2018 -Follow up with surgery per Dr. Donne Hazel -She is welcome to return back to the Survivorship Clinic at any time; no additional follow-up needed at this time.  -Consider referral back to survivorship as a long-term survivor for continued surveillance  A total of (30) minutes of face-to-face time was spent with this patient with greater than 50% of that time in counseling and care-coordination.   Gardenia Phlegm, NP Survivorship Program [facility] [phone]   Note: PRIMARY CARE PROVIDER Crist Infante, [place] [phone]

## 2018-06-24 NOTE — Telephone Encounter (Signed)
No los °

## 2018-06-28 DIAGNOSIS — Z1331 Encounter for screening for depression: Secondary | ICD-10-CM | POA: Diagnosis not present

## 2018-06-28 DIAGNOSIS — Z0000 Encounter for general adult medical examination without abnormal findings: Secondary | ICD-10-CM | POA: Diagnosis not present

## 2018-06-28 DIAGNOSIS — I10 Essential (primary) hypertension: Secondary | ICD-10-CM | POA: Diagnosis not present

## 2018-06-28 DIAGNOSIS — R011 Cardiac murmur, unspecified: Secondary | ICD-10-CM | POA: Diagnosis not present

## 2018-06-28 DIAGNOSIS — R252 Cramp and spasm: Secondary | ICD-10-CM | POA: Diagnosis not present

## 2018-06-28 DIAGNOSIS — R5383 Other fatigue: Secondary | ICD-10-CM | POA: Diagnosis not present

## 2018-06-28 DIAGNOSIS — R7989 Other specified abnormal findings of blood chemistry: Secondary | ICD-10-CM | POA: Diagnosis not present

## 2018-06-28 DIAGNOSIS — H268 Other specified cataract: Secondary | ICD-10-CM | POA: Diagnosis not present

## 2018-06-28 DIAGNOSIS — C50911 Malignant neoplasm of unspecified site of right female breast: Secondary | ICD-10-CM | POA: Diagnosis not present

## 2018-06-28 DIAGNOSIS — Z6829 Body mass index (BMI) 29.0-29.9, adult: Secondary | ICD-10-CM | POA: Diagnosis not present

## 2018-06-28 DIAGNOSIS — Z1389 Encounter for screening for other disorder: Secondary | ICD-10-CM | POA: Diagnosis not present

## 2018-06-28 DIAGNOSIS — M5030 Other cervical disc degeneration, unspecified cervical region: Secondary | ICD-10-CM | POA: Diagnosis not present

## 2018-06-28 DIAGNOSIS — R197 Diarrhea, unspecified: Secondary | ICD-10-CM | POA: Diagnosis not present

## 2018-06-29 ENCOUNTER — Other Ambulatory Visit (HOSPITAL_COMMUNITY): Payer: Self-pay | Admitting: Internal Medicine

## 2018-06-29 ENCOUNTER — Telehealth: Payer: Self-pay

## 2018-06-29 DIAGNOSIS — R011 Cardiac murmur, unspecified: Secondary | ICD-10-CM

## 2018-06-29 MED FILL — TRIAMTERENE/HCTZ 37.5/25 TB: 37.5-25 | 90 days supply | Qty: 45 | Fill #0

## 2018-06-29 MED FILL — METOPROLOL SUCCINATE ER 25: 25 | 90 days supply | Qty: 90 | Fill #0

## 2018-06-29 NOTE — Telephone Encounter (Signed)
Spoke with patient concerning the rescheduling of her appointments for a earlier date. Per 1/22 Declined letter and calender.

## 2018-07-08 ENCOUNTER — Other Ambulatory Visit: Payer: Self-pay | Admitting: Pharmacist

## 2018-07-08 NOTE — Patient Outreach (Signed)
[facility]) Care Management  [place]   07/08/2018  DOCIE ABRAMOVICH [birth year] [id number]  Target Medication(s): rosuvastatin Current insurance:[employer]   Outreach:  Outreach call to Ernestine Mcmurray regarding her request for follow up from the [facility] Medication Adherence Campaign. Left a HIPAA compliant message on the patient's voicemail.   Harlow Asa, PharmD, [place] Management [phone]

## 2018-07-11 ENCOUNTER — Other Ambulatory Visit: Payer: Self-pay | Admitting: Pharmacist

## 2018-07-11 MED FILL — ROSUVASTATIN CALCIUM 5 MG T: 5 | 90 days supply | Qty: 90 | Fill #2

## 2018-07-11 NOTE — Patient Outreach (Signed)
[facility]) Care Management  [place]   07/11/2018  FENDI MEINHARDT 23-Jul-1940 [id number]  Target Medication(s): rosuvastatin 5 mg Date & Supply of last refill (if available): 04/19/18, 90 day supply Current insurance:[employer]   Outreach:  Incoming call from Ernestine Mcmurray in response to the [facility] Medication Adherence Campaign. Speak with patient. HIPAA identifiers verified.  Subjective:   Mrs. Shartzer reports that she takes her rosuvastatin 5 mg once daily as directed. Reports that earlier in the year, she and her husband had been sharing this cholesterol medication as they were on the same dose. However, reports that they have since stopped doing this. Advise patient against sharing medication in this way in the future due to the risks. Counsel patient on the importance of medication adherence.  Patient asks about the probiotic Align extra strength. Patient reports that she has been taking this over the counter at times. Note that per data from the [employer] Medic[employer] database, some clinical research has evaluated bifidobacteria-containing probiotics for increasing remission rates in patients with ulcerative colitis, showing it to be likely effective. Note to patient that data is limited due to a lack of larger studies to evaluate this product, particularly as it has not gone through the FDA approval process. Patient states that her physician is aware that she is taking this supplement.  Patient denies further medication questions/concerns at this time.   Objective: Lab Results  Component Value Date   CREATININE 0.76 03/14/2018   CREATININE 0.84 08/16/2017   CREATININE 0.70 03/15/2017    Lipid Panel  No results found for: CHOL, TRIG, HDL, CHOLHDL, VLDL, LDLCALC, LDLDIRECT    Allergies  Allergen Reactions  . Codeine Hives  . Sulfa Antibiotics Rash     Assessment:  . Patient reports currently taking prescription as  directed.   Plan:  Will close pharmacy episode.   Harlow Asa, PharmD, [facility] Management [phone]

## 2018-07-12 ENCOUNTER — Ambulatory Visit (HOSPITAL_COMMUNITY): Payer: PPO | Attending: Cardiovascular Disease

## 2018-07-12 DIAGNOSIS — R011 Cardiac murmur, unspecified: Secondary | ICD-10-CM | POA: Insufficient documentation

## 2018-07-25 MED FILL — TAMOXIFEN CITRATE 20 MG TAB: 20 | 30 days supply | Qty: 30 | Fill #7

## 2018-07-28 ENCOUNTER — Encounter

## 2018-07-28 ENCOUNTER — Ambulatory Visit: Payer: PPO | Admitting: Sports Medicine

## 2018-07-28 DIAGNOSIS — Z08 Encounter for follow-up examination after completed treatment for malignant neoplasm: Secondary | ICD-10-CM | POA: Diagnosis not present

## 2018-08-02 ENCOUNTER — Telehealth: Payer: Self-pay | Admitting: Neurology

## 2018-08-02 ENCOUNTER — Other Ambulatory Visit: Payer: Self-pay | Admitting: Neurology

## 2018-08-02 DIAGNOSIS — G9340 Encephalopathy, unspecified: Secondary | ICD-10-CM

## 2018-08-02 DIAGNOSIS — R51 Headache: Secondary | ICD-10-CM

## 2018-08-02 DIAGNOSIS — S064X9A Epidural hemorrhage with loss of consciousness of unspecified duration, initial encounter: Secondary | ICD-10-CM

## 2018-08-02 DIAGNOSIS — R519 Headache, unspecified: Secondary | ICD-10-CM

## 2018-08-02 DIAGNOSIS — G454 Transient global amnesia: Secondary | ICD-10-CM

## 2018-08-02 DIAGNOSIS — S064XAA Epidural hemorrhage with loss of consciousness status unknown, initial encounter: Secondary | ICD-10-CM
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
## 2018-08-03 ENCOUNTER — Ambulatory Visit
Admission: RE | Admit: 2018-08-03 | Discharge: 2018-08-03 | Disposition: A | Discharge status: Home / Self Care | Payer: PPO | Source: Ambulatory Visit | Attending: Neurology | Admitting: Neurology

## 2018-08-03 ENCOUNTER — Telehealth: Payer: Self-pay | Admitting: Neurology

## 2018-08-03 DIAGNOSIS — R413 Other amnesia: Secondary | ICD-10-CM | POA: Diagnosis not present

## 2018-08-03 DIAGNOSIS — S064XAA Epidural hemorrhage with loss of consciousness status unknown, initial encounter: Secondary | ICD-10-CM

## 2018-08-03 DIAGNOSIS — G454 Transient global amnesia: Secondary | ICD-10-CM

## 2018-08-03 DIAGNOSIS — S064X9A Epidural hemorrhage with loss of consciousness of unspecified duration, initial encounter: Secondary | ICD-10-CM

## 2018-08-03 DIAGNOSIS — R519 Headache, unspecified: Secondary | ICD-10-CM

## 2018-08-03 DIAGNOSIS — R51 Headache: Secondary | ICD-10-CM | POA: Diagnosis not present

## 2018-08-03 DIAGNOSIS — G9340 Encephalopathy, unspecified: Secondary | ICD-10-CM

## 2018-08-03 NOTE — Telephone Encounter (Signed)
Raquel Sarna, did you get this message from me? thanks

## 2018-08-03 NOTE — Telephone Encounter (Signed)
health team order sent to GI. No auth they will reach out to the pt to schedule.  °

## 2018-08-03 NOTE — Telephone Encounter (Signed)
Dr. Leta Baptist or Dr. Felecia Shelling, can one of you read this CT for me please? Patient is calling, her husband is a physician.

## 2018-08-04 NOTE — Telephone Encounter (Signed)
I spoke with the patient and informed her that there were no acute findings and no bleeding. She verbalized appreciation and had no questions.   Dr. Jaynee Eagles aware that I called pt.

## 2018-08-04 NOTE — Telephone Encounter (Signed)
I never received the message. She did have the CT done yesterday at GI.

## 2018-08-04 NOTE — Telephone Encounter (Signed)
Please call patient and let her know no bleeding

## 2018-08-04 NOTE — Telephone Encounter (Signed)
Note final report ready for viewing from Dr. Leta Baptist.  IMPRESSION:   Unremarkable CT head (without). No acute findings.

## 2018-08-19 MED FILL — TAMOXIFEN CITRATE 20 MG TAB: 20 | 30 days supply | Qty: 30 | Fill #8 | Status: TO

## 2018-08-22 ENCOUNTER — Inpatient Hospital Stay: Payer: PPO | Admitting: Oncology

## 2018-08-22 ENCOUNTER — Inpatient Hospital Stay: Payer: PPO

## 2018-08-24 MED FILL — TRIAMTERENE/HCTZ 37.5/25 TB: 37.5-25 | 90 days supply | Qty: 45 | Fill #1

## 2018-09-02 DIAGNOSIS — M21612 Bunion of left foot: Secondary | ICD-10-CM | POA: Diagnosis not present

## 2018-09-02 DIAGNOSIS — M21622 Bunionette of left foot: Secondary | ICD-10-CM | POA: Diagnosis not present

## 2018-09-02 DIAGNOSIS — M21611 Bunion of right foot: Secondary | ICD-10-CM | POA: Diagnosis not present

## 2018-09-02 DIAGNOSIS — M7752 Other enthesopathy of left foot: Secondary | ICD-10-CM | POA: Diagnosis not present

## 2018-09-05 ENCOUNTER — Other Ambulatory Visit: Payer: PPO

## 2018-09-05 ENCOUNTER — Ambulatory Visit: Payer: PPO | Admitting: Oncology

## 2018-09-07 DIAGNOSIS — K52831 Collagenous colitis: Secondary | ICD-10-CM | POA: Diagnosis not present

## 2018-09-15 MED FILL — BUDESONIDE EC 3 MG CAPSULE: 3 | 30 days supply | Qty: 90 | Fill #0

## 2018-09-20 DIAGNOSIS — M21622 Bunionette of left foot: Secondary | ICD-10-CM | POA: Diagnosis not present

## 2018-09-20 DIAGNOSIS — M7752 Other enthesopathy of left foot: Secondary | ICD-10-CM | POA: Diagnosis not present

## 2018-09-20 DIAGNOSIS — M21612 Bunion of left foot: Secondary | ICD-10-CM | POA: Diagnosis not present

## 2018-09-27 MED FILL — TAMOXIFEN CITRATE 20 MG TAB: 20 | 30 days supply | Qty: 30 | Fill #0

## 2018-09-27 MED FILL — METOPROLOL SUCCINATE ER 25: 25 | 90 days supply | Qty: 90 | Fill #0

## 2018-10-10 MED FILL — SF 5000 PLUS CREAM: 1.1 | 30 days supply | Qty: 51 | Fill #0

## 2018-10-12 IMAGING — MR MR ABDOMEN WO/W CM MRCP
10 of 23 series · 20 of 48 positions shown · IV contrast (Yes)
Comparison: CT 03/05/2017

CLINICAL DATA: Elevated liver function tests.  Abdominal pain.

EXAM:
MRI ABDOMEN WITHOUT AND WITH CONTRAST (INCLUDING MRCP)
TECHNIQUE: Multiplanar multisequence MR imaging of the abdomen was performed
both before and after the administration of intravenous contrast.
Heavily T2-weighted images of the biliary and pancreatic ducts were
obtained, and three-dimensional MRCP images were rendered by post
processing.
CONTRAST:  14 cc MultiHance

[Series 3: T2 fat-sat · axial · 5.0mm · 0.78mm/px · z∈[-71,+154]mm · 2 of 46 slices shown]
[im 1/46]
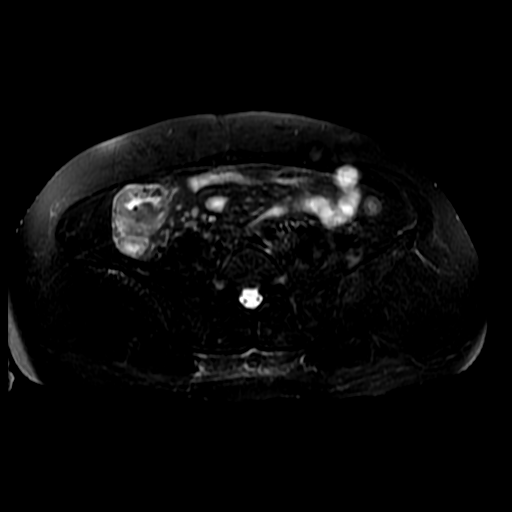
[im 46/46]
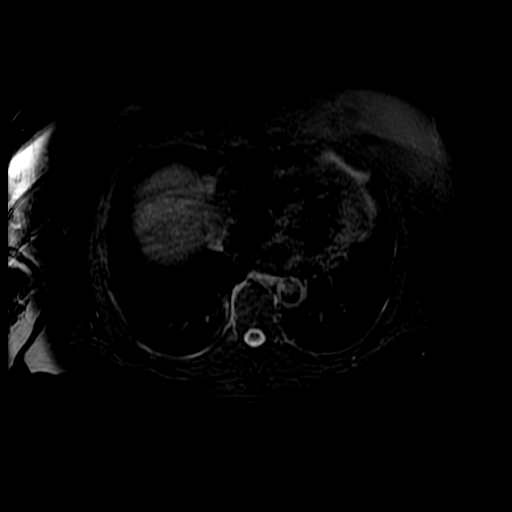

[Series 5: DWI b500 · axial · 6.0mm · 1.48mm/px · z∈[-69,+173]mm · 2 of 64 slices shown]
[im 1/64]
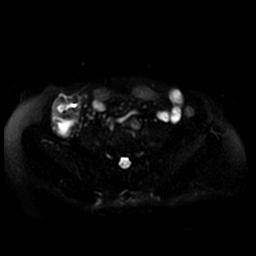
[im 64/64]
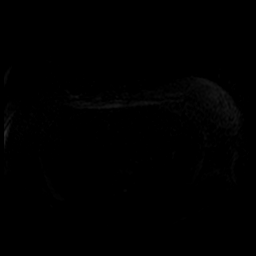

[Series 6: MRCP · coronal · 2.0mm · 0.70mm/px · 1 of 51 slices shown (1 of 2)]
[im 1/51]
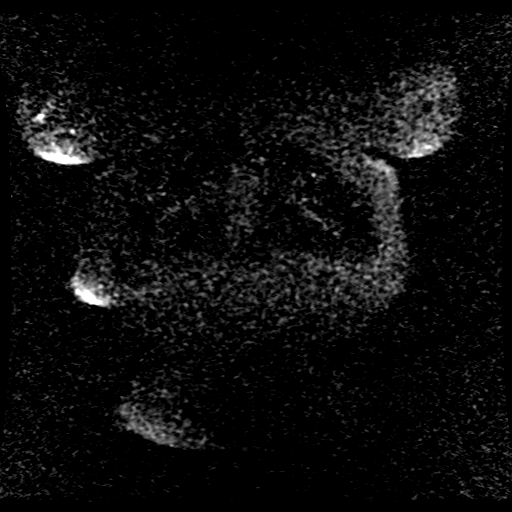

[Series 7: ax dualecho · axial · 5.0mm · 0.78mm/px · z∈[-71,+154]mm · 2 of 92 slices shown]
[im 1/92]
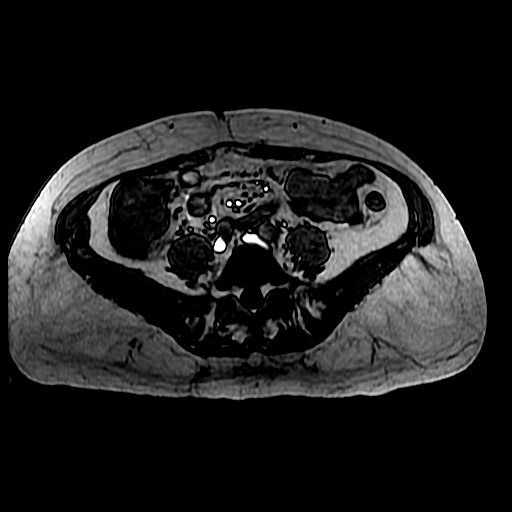
[im 92/92]
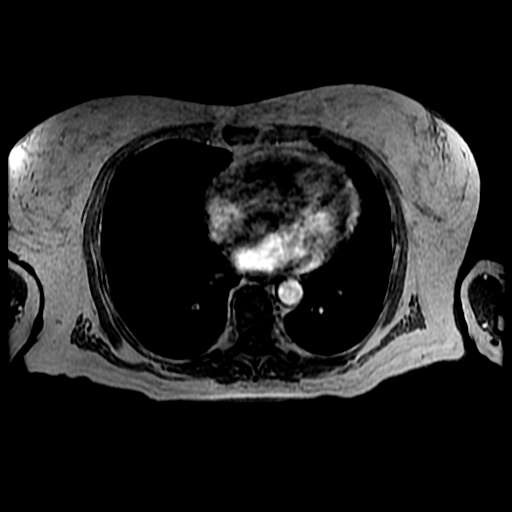

[Series 8: MRCP · coronal · 40.0mm · 0.70mm/px · 1 of 9 slices shown (2 of 2)]
[im 1/9]
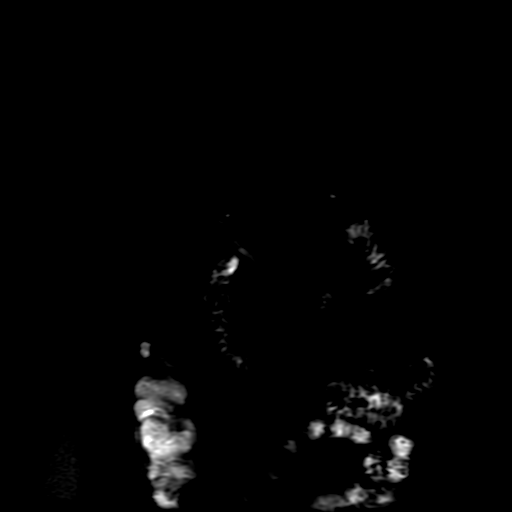

[Series 9: bSSFP fat-sat · coronal · 5.0mm · 0.70mm/px · 1 of 38 slices shown]
[im 1/38]
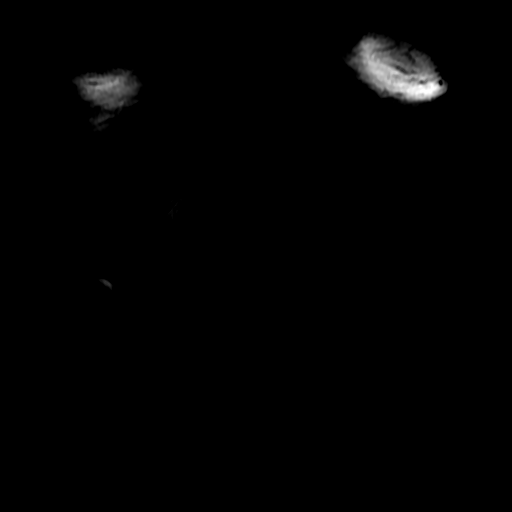

[Series 10: T2 · axial · 5.0mm · 0.78mm/px · 1 of 44 slices shown]
[im 1/44]
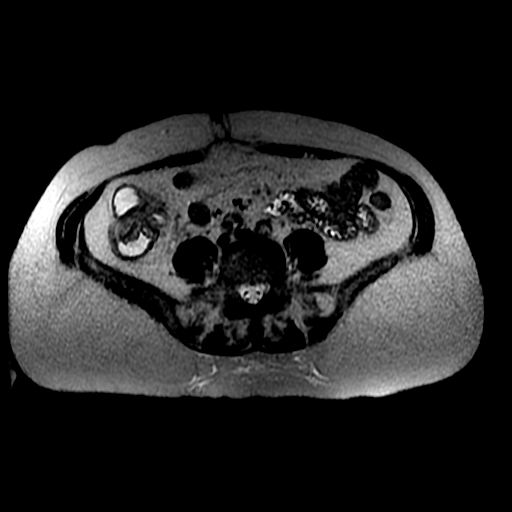

[Series 12: T1 dynamic · coronal · delayed · 8.0mm · 0.72mm/px · 2 of 72 slices shown]
[im 1/72]
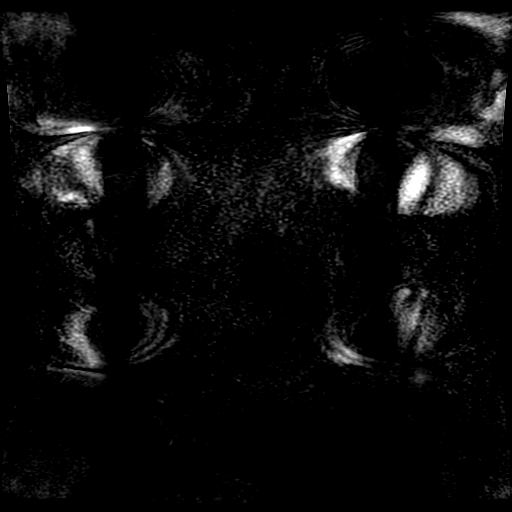
[im 72/72]
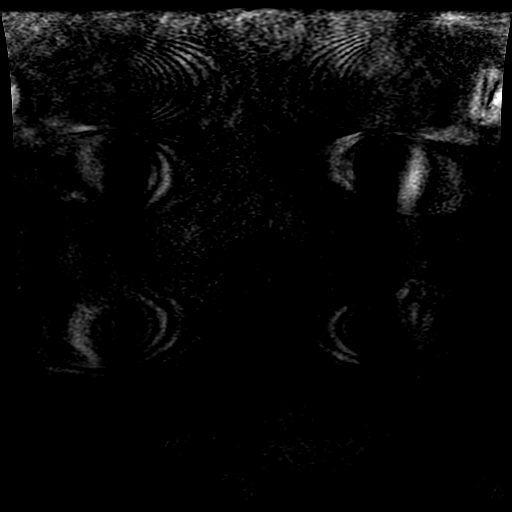

[Series 400: reformatted · axial · 1.8mm · 0.62mm/px · z∈[+63,+193]mm · 4 of 158 slices shown (1 of 2)]
[im 1/158]
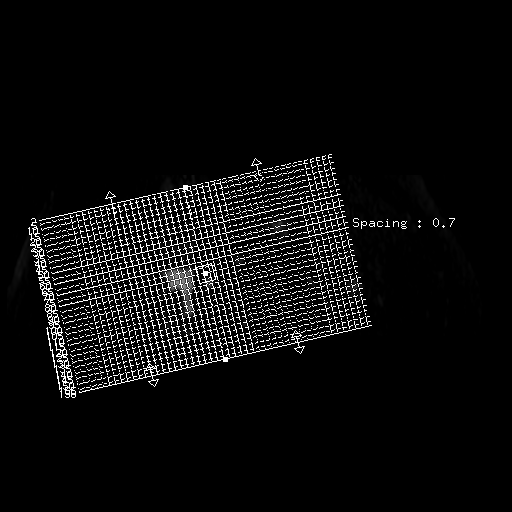
[im 53/158]
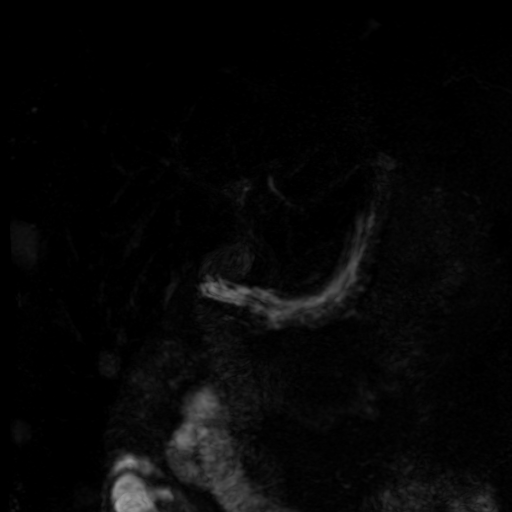
[im 105/158]
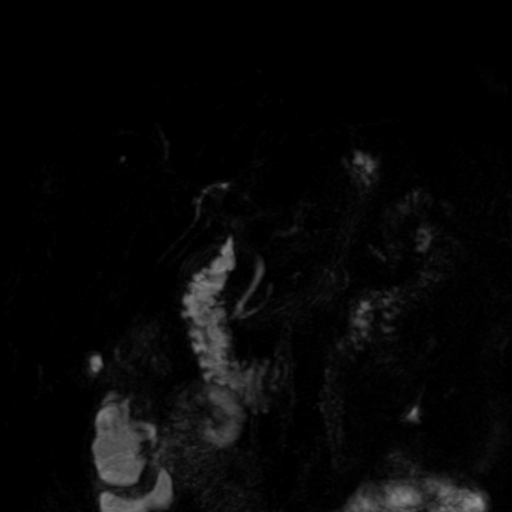
[im 158/158]
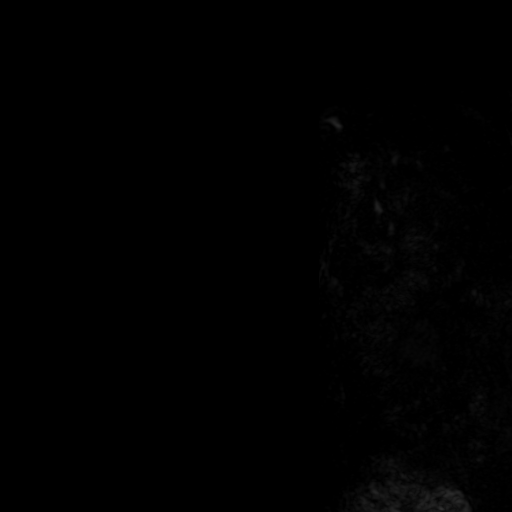

[Series 401: reformatted · coronal · 100.0mm · 0.51mm/px · 4 of 180 slices shown (2 of 2)]
[im 1/180]
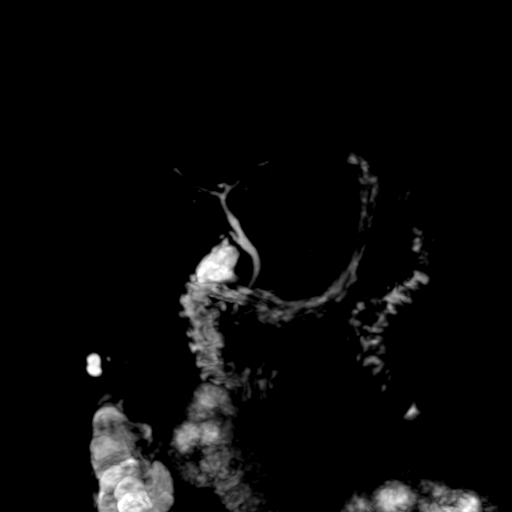
[im 45/180]
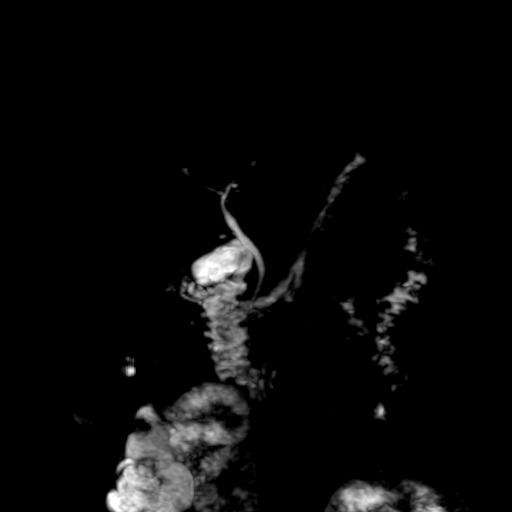
[im 90/180]
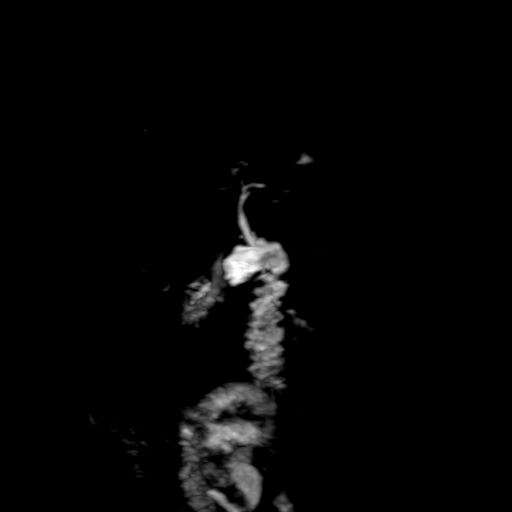
[im 135/180]
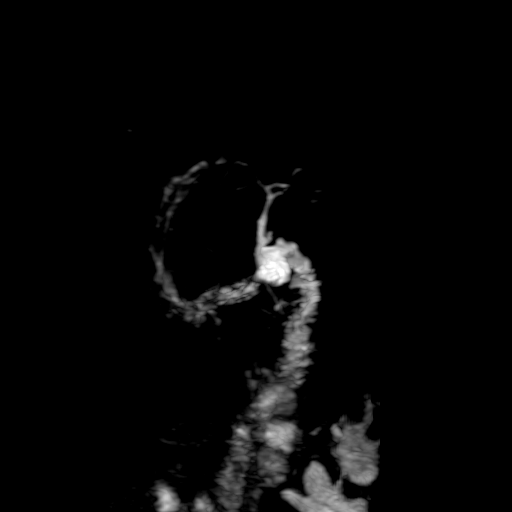

[20 of 48 positions shown; findings below may reference images not displayed]

FINDINGS: Lower chest: Normal heart size without pericardial or pleural
effusion. Tiny hiatal hernia.

Hepatobiliary: Inferior right hepatic lobe 8 mm cyst.
Cholecystectomy. No biliary duct dilatation. Common duct normal at 5
mm on image 76/series 4. No choledocholithiasis.

Pancreas:  Normal, without mass or ductal dilatation.

Spleen:  Normal in size, without focal abnormality.

Adrenals/Urinary Tract: Normal adrenal glands. Normal kidneys,
without hydronephrosis.

Stomach/Bowel: Normal abdominal bowel loops.

Vascular/Lymphatic: Normal caliber of the aorta and branch vessels.
No retroperitoneal or retrocrural adenopathy.

Other:  No ascites.

Musculoskeletal: No acute osseous abnormality.
IMPRESSION: 1. No acute process or explanation for elevated liver function
tests/pain.
2.  Tiny hiatal hernia.

## 2018-10-24 MED FILL — TAMOXIFEN CITRATE 20 MG TAB: 20 | 30 days supply | Qty: 30 | Fill #1
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
## 2018-10-25 ENCOUNTER — Telehealth: Payer: Self-pay | Admitting: Oncology

## 2018-10-25 MED FILL — ROSUVASTATIN CALCIUM 5 MG T: 5 | 90 days supply | Qty: 90 | Fill #0

## 2018-10-25 NOTE — Telephone Encounter (Signed)
Left vm for pt to call back and try to convert office visit for 10/26/18 into [employer].

## 2018-10-25 NOTE — Telephone Encounter (Signed)
Called patient regarding upcoming Web[facility] appointment, patient is notified and e-mail has been sent. °

## 2018-10-26 ENCOUNTER — Other Ambulatory Visit: Payer: PPO

## 2018-10-26 ENCOUNTER — Inpatient Hospital Stay: Payer: PPO | Attending: Oncology | Admitting: Oncology

## 2018-10-26 DIAGNOSIS — C50411 Malignant neoplasm of upper-outer quadrant of right female breast: Secondary | ICD-10-CM | POA: Diagnosis not present

## 2018-10-26 DIAGNOSIS — Z79810 Long term (current) use of selective estrogen receptor modulators (SERMs): Secondary | ICD-10-CM

## 2018-10-26 DIAGNOSIS — Z79899 Other long term (current) drug therapy: Secondary | ICD-10-CM | POA: Diagnosis not present

## 2018-10-26 DIAGNOSIS — Z170 Estrogen receptor positive status [ER+]: Secondary | ICD-10-CM | POA: Diagnosis not present

## 2018-10-26 MED ORDER — TAMOXIFEN CITRATE 20 MG PO TABS: 20.0000 mg | ORAL_TABLET | Freq: Every day | ORAL | 12 refills | Status: DC

## 2018-10-27 DIAGNOSIS — M21622 Bunionette of left foot: Secondary | ICD-10-CM | POA: Diagnosis not present

## 2018-10-27 DIAGNOSIS — M7752 Other enthesopathy of left foot: Secondary | ICD-10-CM | POA: Diagnosis not present

## 2018-10-27 DIAGNOSIS — M21612 Bunion of left foot: Secondary | ICD-10-CM | POA: Diagnosis not present

## 2018-11-30 MED FILL — TAMOXIFEN CITRATE 20 MG TAB: 20 | 90 days supply | Qty: 90 | Fill #0

## 2018-12-13 DIAGNOSIS — D2262 Melanocytic nevi of left upper limb, including shoulder: Secondary | ICD-10-CM | POA: Diagnosis not present

## 2018-12-13 DIAGNOSIS — D692 Other nonthrombocytopenic purpura: Secondary | ICD-10-CM | POA: Diagnosis not present

## 2018-12-13 DIAGNOSIS — D1801 Hemangioma of skin and subcutaneous tissue: Secondary | ICD-10-CM | POA: Diagnosis not present

## 2018-12-13 DIAGNOSIS — L821 Other seborrheic keratosis: Secondary | ICD-10-CM | POA: Diagnosis not present

## 2018-12-13 DIAGNOSIS — D2272 Melanocytic nevi of left lower limb, including hip: Secondary | ICD-10-CM | POA: Diagnosis not present

## 2018-12-14 MED FILL — BUDESONIDE 3 MG CAP: 3 | 30 days supply | Qty: 90 | Fill #0

## 2018-12-15 DIAGNOSIS — M7752 Other enthesopathy of left foot: Secondary | ICD-10-CM | POA: Diagnosis not present

## 2018-12-15 DIAGNOSIS — M21612 Bunion of left foot: Secondary | ICD-10-CM | POA: Diagnosis not present

## 2018-12-15 DIAGNOSIS — M205X2 Other deformities of toe(s) (acquired), left foot: Secondary | ICD-10-CM | POA: Diagnosis not present

## 2018-12-16 DIAGNOSIS — H903 Sensorineural hearing loss, bilateral: Secondary | ICD-10-CM | POA: Diagnosis not present

## 2018-12-16 DIAGNOSIS — H9313 Tinnitus, bilateral: Secondary | ICD-10-CM | POA: Diagnosis not present

## 2018-12-16 DIAGNOSIS — H9123 Sudden idiopathic hearing loss, bilateral: Secondary | ICD-10-CM | POA: Diagnosis not present

## 2018-12-16 MED FILL — predniSONE 10 MG TABS: 10 | 30 days supply | Qty: 60 | Fill #0

## 2018-12-23 MED FILL — TRIAMTERENE/HCTZ 37.5/25 TB: 37.5-25 | 90 days supply | Qty: 45 | Fill #2

## 2019-01-04 DIAGNOSIS — H903 Sensorineural hearing loss, bilateral: Secondary | ICD-10-CM | POA: Diagnosis not present

## 2019-01-04 DIAGNOSIS — H9123 Sudden idiopathic hearing loss, bilateral: Secondary | ICD-10-CM | POA: Diagnosis not present

## 2019-01-05 MED FILL — SF 5000 PLUS CREAM: 1.1 | 30 days supply | Qty: 51 | Fill #0

## 2019-01-09 MED FILL — METOPROLOL SUCCINATE ER 25: 25 | 90 days supply | Qty: 90 | Fill #0

## 2019-02-21 MED FILL — ROSUVASTATIN CALCIUM 5 MG T: 5 | 90 days supply | Qty: 90 | Fill #0

## 2019-03-06 DIAGNOSIS — K52831 Collagenous colitis: Secondary | ICD-10-CM | POA: Diagnosis not present

## 2019-03-08 MED FILL — TAMOXIFEN CITRATE 20 MG TAB: 20 | 90 days supply | Qty: 90 | Fill #1

## 2019-03-11 DIAGNOSIS — Z23 Encounter for immunization: Secondary | ICD-10-CM | POA: Diagnosis not present

## 2019-03-21 MED FILL — TRIAMTERENE-HCTZ 37.5-25 MG: 37.5-25 | 90 days supply | Qty: 45 | Fill #3

## 2019-03-21 MED FILL — BUDESONIDE 3 MG CAP: 3 | 30 days supply | Qty: 90 | Fill #1

## 2019-04-03 ENCOUNTER — Encounter (INDEPENDENT_AMBULATORY_CARE_PROVIDER_SITE_OTHER): Payer: Self-pay

## 2019-04-11 ENCOUNTER — Other Ambulatory Visit: Payer: Self-pay

## 2019-04-11 DIAGNOSIS — Z20822 Contact with and (suspected) exposure to covid-19: Secondary | ICD-10-CM

## 2019-04-12 LAB — NOVEL CORONAVIRUS, NAA: SARS-CoV-2, NAA: NOT DETECTED

## 2019-04-13 ENCOUNTER — Telehealth: Payer: Self-pay | Admitting: Hematology

## 2019-04-13 MED FILL — METOPROLOL SUCCINATE ER 25: 25 | 90 days supply | Qty: 90 | Fill #1

## 2019-04-13 NOTE — Telephone Encounter (Signed)
Pt is aware covid 19 test is neg  

## 2019-06-12 MED FILL — TAMOXIFEN 20 MG TABLET: 20 | 90 days supply | Qty: 90 | Fill #2

## 2019-06-12 MED FILL — SF 5000 PLUS CREAM: 1.1 | 30 days supply | Qty: 51 | Fill #1

## 2019-06-20 ENCOUNTER — Telehealth: Payer: Self-pay | Admitting: Cardiology

## 2019-06-20 DIAGNOSIS — E785 Hyperlipidemia, unspecified: Secondary | ICD-10-CM

## 2019-06-20 NOTE — Telephone Encounter (Signed)
Left a message for the patient to call back. The patient has not seen Dr. Martinique.

## 2019-06-20 NOTE — Telephone Encounter (Signed)
Patient is calling wanting to know if Dr. Martinique feels she should have a coronary calcium test performed. If so she would like Dr. Martinique to put in a request for her to have it performed before her upcoming visit with her PCP. Please advise.

## 2019-06-21 NOTE — Telephone Encounter (Signed)
Returned call to patient Dr.Jordan's advice left on personal voice mail.Scheduler will call back with appointment.

## 2019-06-21 NOTE — Telephone Encounter (Addendum)
Received a call from patient she wanted to ask Dr.Jordan about scheduling a calcium score.Stated she has not been able to take statins due to muscle pain.She has tried lipitor and crestor,but has not taken any for over 6 months.Stated she read if calcium score over 100 statins are recommended. She would like Dr.Jordan's advice and wanted to know if she needs a appointment.Advised I will send message to [place].

## 2019-06-21 NOTE — Telephone Encounter (Signed)
I think a coronary calcium score is a good idea. I am happy to order. Dx hypercholesterolemia.   Chantee Cerino Martinique MD, [facility]

## 2019-06-21 NOTE — Telephone Encounter (Signed)
LM2CB - pt has not seen Dr. Martinique

## 2019-06-21 NOTE — Addendum Note (Signed)
Addended by: Kathyrn Lass on: 06/21/2019 01:44 PM   Modules accepted: Orders

## 2019-06-22 MED FILL — BUDESONIDE 3 MG CAP: 3 | 30 days supply | Qty: 90 | Fill #2

## 2019-06-28 ENCOUNTER — Ambulatory Visit (INDEPENDENT_AMBULATORY_CARE_PROVIDER_SITE_OTHER)
Admission: RE | Admit: 2019-06-28 | Discharge: 2019-06-28 | Disposition: A | Discharge status: Home / Self Care | Payer: Medicare HMO | Source: Ambulatory Visit | Attending: Cardiology | Admitting: Cardiology

## 2019-06-28 ENCOUNTER — Other Ambulatory Visit: Payer: Self-pay

## 2019-06-28 DIAGNOSIS — E785 Hyperlipidemia, unspecified: Secondary | ICD-10-CM

## 2019-06-29 ENCOUNTER — Ambulatory Visit: Payer: Medicare HMO | Attending: Internal Medicine

## 2019-06-29 DIAGNOSIS — Z23 Encounter for immunization: Secondary | ICD-10-CM | POA: Insufficient documentation

## 2019-06-29 NOTE — Progress Notes (Signed)
   Covid-19 Vaccination Clinic  Name:  Stephanie Liu    MRN: [id number] DOB: [birth year]  06/29/2019  [name] was observed post Covid-19 immunization for 15 minutes without incidence. She was provided with Vaccine Information Sheet and instruction to access the V-Safe system.   [name] was instructed to call 911 with any severe reactions post vaccine: Marland Kitchen Difficulty breathing  . Swelling of your face and throat  . A fast heartbeat  . A bad rash all over your body  . Dizziness and weakness    Immunizations Administered    Name Date Dose VIS Date Route   Pfizer COVID-19 Vaccine 06/29/2019 10:35 AM 0.3 mL 05/19/2019 Intramuscular   Manufacturer: [employer], [employer]   Lot: [id number]   [place]: [id number]

## 2019-07-03 ENCOUNTER — Telehealth: Payer: Self-pay | Admitting: Cardiology

## 2019-07-03 ENCOUNTER — Ambulatory Visit: Payer: PPO

## 2019-07-03 NOTE — Telephone Encounter (Signed)
She is fine to wait until her appointment. She can let me know if symptoms get any worse  Maie Kesinger Martinique MD, [facility]

## 2019-07-03 NOTE — Telephone Encounter (Signed)
Returned call to patient-patient states she is scheduled to see Dr. Martinique 2/16 to discuss CT cardiac scoring.   She states husband thought it was okay to wait but her kids were concerned and she wanted to verify that Dr. Martinique thought it was okay to wait.   She states she is having SOB when exercising, states she walks a lot in her neighborhood and gets out of breath going up hill.   States she also might be a little fatigued but contributed this to age.   Denies CP.  Advised I would send message to Dr. Martinique to review and follow up with her.   She states if Dr. Martinique is okay for her to wait until 02/16, then they are ok with this as well.

## 2019-07-03 NOTE — Telephone Encounter (Signed)
Patient is calling to inquire if Dr. Martinique thinks that it is necessary to see patient any sooner or if she should keep appointment scheduled for 07/25/19.

## 2019-07-03 NOTE — Telephone Encounter (Signed)
Patient aware and verbalized understanding. °

## 2019-07-05 MED FILL — TRIAMTERENE/HCTZ 37.5/25 TB: 37.5-25 | 90 days supply | Qty: 45 | Fill #0

## 2019-07-13 DIAGNOSIS — R69 Illness, unspecified: Secondary | ICD-10-CM | POA: Diagnosis not present

## 2019-07-17 DIAGNOSIS — Z780 Asymptomatic menopausal state: Secondary | ICD-10-CM | POA: Diagnosis not present

## 2019-07-17 DIAGNOSIS — Z0000 Encounter for general adult medical examination without abnormal findings: Secondary | ICD-10-CM | POA: Diagnosis not present

## 2019-07-17 DIAGNOSIS — D7589 Other specified diseases of blood and blood-forming organs: Secondary | ICD-10-CM | POA: Diagnosis not present

## 2019-07-17 DIAGNOSIS — E7849 Other hyperlipidemia: Secondary | ICD-10-CM | POA: Diagnosis not present

## 2019-07-19 MED FILL — METOPROLOL SUCCINATE ER 25: 25 | 90 days supply | Qty: 90 | Fill #0

## 2019-07-20 ENCOUNTER — Ambulatory Visit: Payer: Medicare HMO | Attending: Internal Medicine

## 2019-07-20 DIAGNOSIS — Z23 Encounter for immunization: Secondary | ICD-10-CM

## 2019-07-20 NOTE — Progress Notes (Signed)
   Covid-19 Vaccination Clinic  Name:  LOURENA LICHT    MRN: [id number] DOB: 08/26/1940  07/20/2019  Ms. Griswold was observed post Covid-19 immunization for 15 minutes without incidence. She was provided with Vaccine Information Sheet and instruction to access the V-Safe system.   Ms. Gruden was instructed to call 911 with any severe reactions post vaccine: Marland Kitchen Difficulty breathing  . Swelling of your face and throat  . A fast heartbeat  . A bad rash all over your body  . Dizziness and weakness    Immunizations Administered    Name Date Dose VIS Date Route   Pfizer COVID-19 Vaccine 07/20/2019  8:34 AM 0.3 mL 05/19/2019 Intramuscular   Manufacturer: [place]   Lot: [id number]   [place]: [id number]

## 2019-07-21 NOTE — Progress Notes (Signed)
Cardiology Office Note   Date:  07/25/2019   ID:  Stephanie, Liu 1940-09-17, MRN [id number]  PCP:  Crist Infante, MD  Cardiologist:   Treniyah Lynn Martinique, MD   Chief Complaint  Patient presents with  . Coronary Artery Disease      History of Present Illness: Stephanie Liu is a 79 y.o. female who is see at the request of Dr Joylene Draft for evaluation of abnormal coronary calcium score. She has a history of HTN and HLD. She has a chronic RBBB. She had normal Myoview studies in 2003 and 2008 and a normal stress Echo in 2011. She has a chronic RBBB. She has had difficulty tolerating statins. To evaluate her CV risk she recently had a coronary calcium score. This was elevated at 643. Heavy calcification in the LAD territory.   She does note symptoms of dyspnea on exertion. She will walk in her neighborhood for 3 miles and notes SOB going up hills and others have noted she is SOB walking on any grade to the point she has difficulty talking. She also noted she is more fatigued than she has been in the past. No chest pain or pressure. Her brother died of an MI at age 5. Sister has dementia. She has a history of prior breast CA. Was diagnosed with collagenous colitis 2 years ago and is on budesonide. She has had difficulty taking statins in the past due to myalgias. She has been on Crestor 5 mg daily. After recent labs Dr Joylene Draft recommended increasing Crestor to 10 mg daily and adding Zetia.    Past Medical History:  Diagnosis Date  . CMV infection ([place])   . Collagenous colitis   . Family history of breast cancer   . Family history of melanoma   . Family history of pancreatic cancer   . Family history of prostate cancer   . HTN (hypertension)   . Hypercholesterolemia   . Normal nuclear stress test 03/18/2007  . Normal stress echocardiogram 02/03/2010   RBBB, CLINICALLY NEGATIVE FOR ANGINA, NORMAL LV FUNCTION WITH MILD LVH    Past Surgical History:  Procedure Laterality Date  .  APPENDECTOMY    . BREAST LUMPECTOMY WITH RADIOACTIVE SEED LOCALIZATION Right 09/13/2017   Procedure: RIGHT BREAST LUMPECTOMY WITH RADIOACTIVE SEED LOCALIZATION, AND RIGHT BREAST SEED GUIDED  EXCISIONAL BIOPSY;  Surgeon: Rolm Bookbinder, MD;  Location: [facility];  Service: General;  Laterality: Right;  . CATARACT EXTRACTION, BILATERAL    . CHOLECYSTECTOMY  1972  . ROTATOR CUFF REPAIR Right   . TOTAL ABDOMINAL HYSTERECTOMY W/ BILATERAL SALPINGOOPHORECTOMY       Current Outpatient Medications  Medication Sig Dispense Refill  . pseudoephedrine-acetaminophen (TYLENOL SINUS) 30-500 MG TABS tablet Take 1 tablet by mouth every 4 (four) hours as needed.    . budesonide (ENTOCORT EC) 3 MG 24 hr capsule Take by mouth daily.    . Cholecalciferol (VITAMIN D3 PO) Take by mouth.    . Coenzyme Q10 (COQ-10) 400 MG CAPS Take 400 mg by mouth daily.    Marland Kitchen ezetimibe (ZETIA) 10 MG tablet Take 10 mg by mouth daily.    . metoprolol succinate (TOPROL-XL) 25 MG 24 hr tablet Take 25 mg by mouth daily.    . Probiotic Product (PROBIOTIC DAILY PO) Take by mouth.    . rosuvastatin (CRESTOR) 10 MG tablet Take 10 mg by mouth at bedtime.    . tamoxifen (NOLVADEX) 20 MG tablet Take 1 tablet (20 mg total) by  mouth daily. 90 tablet 12  . triamterene-hydrochlorothiazide (MAXZIDE-25) 37.5-25 MG tablet      No current facility-administered medications for this visit.    Allergies:   Codeine and Sulfa antibiotics    Social History:  The patient  reports that she has never smoked. She has never used smokeless tobacco. She reports current alcohol use of about 1.0 standard drinks of alcohol per week. She reports that she does not use drugs.   Family History:  The patient's family history includes Alzheimer's disease in her mother; Breast cancer (age of onset: 77) in her sister; Heart attack in her brother; Pancreatic cancer in her cousin; Parkinson's disease in her mother; Prostate cancer in her father; Rheum  arthritis in her paternal aunt.    ROS:  Please see the history of present illness.   Otherwise, review of systems are positive for none.   All other systems are reviewed and negative.    PHYSICAL EXAM: VS:  BP 130/80   Pulse 78   Temp (!) 97 F (36.1 C)   Ht 5\' 3"  (1.6 m)   Wt 162 lb (73.5 kg)   SpO2 95%   BMI 28.70 kg/m  , BMI Body mass index is 28.7 kg/m. GEN: Well nourished, well developed, in no acute distress  HEENT: normal  Neck: no JVD, carotid bruits, or masses Cardiac: RRR; with a soft 1/6 systolic murmur LUSB, no rubs, or gallops,no edema  Respiratory:  clear to auscultation bilaterally, normal work of breathing GI: soft, nontender, nondistended, + BS MS: no deformity or atrophy  Skin: warm and dry, no rash Neuro:  Strength and sensation are intact Psych: euthymic mood, full affect   EKG:  EKG is ordered today. The ekg ordered today demonstrates NSR with RBBB. Rate 78. I have personally reviewed and interpreted this study.    Recent Labs: No results found for requested labs within last 8760 hours.    Lipid Panel No results found for: CHOL, TRIG, HDL, CHOLHDL, VLDL, LDLCALC, LDLDIRECT   Labs dated 07/17/19: cholesterol 181, triglycerides 94, HDL 66, LDL 96. CMET, CBC, TSH normal.   Wt Readings from Last 3 Encounters:  07/25/19 162 lb (73.5 kg)  06/24/18 162 lb 9.6 oz (73.8 kg)  03/14/18 158 lb 1.6 oz (71.7 kg)      Other studies Reviewed: Additional studies/ records that were reviewed today include:   ADDENDUM REPORT: 06/28/2019 12:26  CLINICAL DATA:  Risk stratification  EXAM: Coronary Calcium Score  TECHNIQUE: The patient was scanned on a [employer]. Axial non-contrast 3 mm slices were carried out through the heart. The data set was analyzed on a dedicated work station and scored using the [employer].  FINDINGS: Non-cardiac: See separate report from [facility].  Ascending Aorta: Normal size, no  calcifications.  Pericardium: Normal.  Coronary arteries: Normal origin.  IMPRESSION: Coronary calcium score of 643. This was 79 percentile for age and sex matched control.   Electronically Signed   By: Ena Dawley   On: 06/28/2019 12:26         ASSESSMENT AND PLAN:  1.  Dyspnea on exertion. This may be a matter of conditioning but given elevated coronary calcium score this could be anginal equivalent symptoms. Recommend a stress Myoview to further stratify risk. If normal then would focus on lipid lowering therapy. If abnormal she will need cardiac cath. For now will continue ASA 81 mg daily. If Myoview is normal then I would probably stop this since I am  concerned her bleeding risk may be higher with history of colitis.  2. Coronary calcification noted on CT  3. Hypercholesterolemia. Agree with recommendation for increased therapy by Dr Joylene Draft. If intolerant of higher dose Crestor may need to consider Repatha. 4. HTN controlled. 5. History of collagenous colitis 6. History of breast CA   Current medicines are reviewed at length with the patient today.  The patient does not have concerns regarding medicines.  The following changes have been made:  no change  Labs/ tests ordered today include:   Orders Placed This Encounter  Procedures  . Myocardial Perfusion Imaging  . EKG 12-Lead     Disposition:   FU TBD  Signed, Gurpreet Mikhail Martinique, MD  07/25/2019 5:27 PM    [facility] [address], [place], [place], [address] Phone [phone], Fax [phone]

## 2019-07-24 DIAGNOSIS — R197 Diarrhea, unspecified: Secondary | ICD-10-CM | POA: Diagnosis not present

## 2019-07-24 DIAGNOSIS — I519 Heart disease, unspecified: Secondary | ICD-10-CM | POA: Diagnosis not present

## 2019-07-24 DIAGNOSIS — R3121 Asymptomatic microscopic hematuria: Secondary | ICD-10-CM | POA: Diagnosis not present

## 2019-07-24 DIAGNOSIS — Z1331 Encounter for screening for depression: Secondary | ICD-10-CM | POA: Diagnosis not present

## 2019-07-24 DIAGNOSIS — D7589 Other specified diseases of blood and blood-forming organs: Secondary | ICD-10-CM | POA: Diagnosis not present

## 2019-07-24 DIAGNOSIS — C50911 Malignant neoplasm of unspecified site of right female breast: Secondary | ICD-10-CM | POA: Diagnosis not present

## 2019-07-24 DIAGNOSIS — R7989 Other specified abnormal findings of blood chemistry: Secondary | ICD-10-CM | POA: Diagnosis not present

## 2019-07-24 DIAGNOSIS — M5030 Other cervical disc degeneration, unspecified cervical region: Secondary | ICD-10-CM | POA: Diagnosis not present

## 2019-07-24 DIAGNOSIS — H269 Unspecified cataract: Secondary | ICD-10-CM | POA: Diagnosis not present

## 2019-07-24 DIAGNOSIS — R809 Proteinuria, unspecified: Secondary | ICD-10-CM | POA: Diagnosis not present

## 2019-07-24 DIAGNOSIS — Z0000 Encounter for general adult medical examination without abnormal findings: Secondary | ICD-10-CM | POA: Diagnosis not present

## 2019-07-25 ENCOUNTER — Other Ambulatory Visit: Payer: Self-pay

## 2019-07-25 ENCOUNTER — Encounter: Payer: Self-pay | Admitting: Cardiology

## 2019-07-25 ENCOUNTER — Ambulatory Visit: Payer: Medicare HMO | Admitting: Cardiology

## 2019-07-25 VITALS — BP 130/80 | HR 78 | Temp 97.0°F | Ht 63.0 in | Wt 162.0 lb

## 2019-07-25 DIAGNOSIS — R0609 Other forms of dyspnea: Secondary | ICD-10-CM

## 2019-07-25 DIAGNOSIS — E785 Hyperlipidemia, unspecified: Secondary | ICD-10-CM

## 2019-07-25 DIAGNOSIS — R0600 Dyspnea, unspecified: Secondary | ICD-10-CM | POA: Diagnosis not present

## 2019-07-25 DIAGNOSIS — I10 Essential (primary) hypertension: Secondary | ICD-10-CM

## 2019-07-25 NOTE — Patient Instructions (Signed)
Medication Instructions:  Continue same medications  Lab Work: None ordered  Testing/Procedures: Stress Myoview needs to be scheduled at [facility]  ( Hold Toprol day before and day of stress test )   Follow-Up: At [facility], you and your health needs are our priority.  As part of our continuing mission to provide you with exceptional heart care, we have created designated Provider Care Teams.  These Care Teams include your primary Cardiologist (physician) and Advanced Practice Providers (APPs -  Physician Assistants and Nurse Practitioners) who all work together to provide you with the care you need, when you need it.  Your next appointment:  Will be determined after stress test

## 2019-07-26 ENCOUNTER — Encounter: Payer: Self-pay | Admitting: Cardiology

## 2019-08-01 ENCOUNTER — Inpatient Hospital Stay (HOSPITAL_COMMUNITY): Admission: RE | Admit: 2019-08-01 | Payer: Medicare HMO | Source: Ambulatory Visit

## 2019-08-01 ENCOUNTER — Telehealth: Payer: Self-pay | Admitting: Cardiology

## 2019-08-01 NOTE — Telephone Encounter (Signed)
New message   Patient would like to know if she can have the myocardial done at [facility] [address] office? Please call to discuss.

## 2019-08-01 NOTE — Telephone Encounter (Signed)
Spoke to patient advised scheduler will call back this afternoon and reschedule stress myoview to [employer].Covid test cancelled for this afternoon.She will also reschedule Covid test.

## 2019-08-02 DIAGNOSIS — R928 Other abnormal and inconclusive findings on diagnostic imaging of breast: Secondary | ICD-10-CM | POA: Diagnosis not present

## 2019-08-04 ENCOUNTER — Ambulatory Visit (HOSPITAL_COMMUNITY)
Admission: RE | Admit: 2019-08-04 | Payer: Medicare HMO | Source: Ambulatory Visit | Attending: Cardiology | Admitting: Cardiology

## 2019-08-07 ENCOUNTER — Encounter (HOSPITAL_COMMUNITY): Payer: Medicare HMO

## 2019-08-07 ENCOUNTER — Other Ambulatory Visit (HOSPITAL_COMMUNITY)
Admission: RE | Admit: 2019-08-07 | Discharge: 2019-08-07 | Disposition: A | Discharge status: Home / Self Care | Payer: Medicare HMO | Source: Ambulatory Visit | Attending: Cardiology | Admitting: Cardiology

## 2019-08-07 DIAGNOSIS — Z20822 Contact with and (suspected) exposure to covid-19: Secondary | ICD-10-CM | POA: Diagnosis not present

## 2019-08-07 DIAGNOSIS — Z01812 Encounter for preprocedural laboratory examination: Secondary | ICD-10-CM | POA: Diagnosis not present

## 2019-08-08 LAB — SARS CORONAVIRUS 2 (TAT 6-24 HRS): SARS Coronavirus 2: NEGATIVE

## 2019-08-09 ENCOUNTER — Telehealth (HOSPITAL_COMMUNITY): Payer: Self-pay

## 2019-08-09 DIAGNOSIS — Z961 Presence of intraocular lens: Secondary | ICD-10-CM | POA: Diagnosis not present

## 2019-08-09 DIAGNOSIS — H353131 Nonexudative age-related macular degeneration, bilateral, early dry stage: Secondary | ICD-10-CM | POA: Diagnosis not present

## 2019-08-09 DIAGNOSIS — H5211 Myopia, right eye: Secondary | ICD-10-CM | POA: Diagnosis not present

## 2019-08-09 NOTE — Telephone Encounter (Signed)
Spoke with the patient, detailed instructions. She stated that she would be here for her test. Asked to call back with any questions. EMTP

## 2019-08-10 ENCOUNTER — Other Ambulatory Visit: Payer: Self-pay

## 2019-08-10 ENCOUNTER — Telehealth (HOSPITAL_COMMUNITY): Payer: Self-pay

## 2019-08-10 ENCOUNTER — Ambulatory Visit (HOSPITAL_COMMUNITY): Payer: Medicare HMO | Attending: Internal Medicine

## 2019-08-10 VITALS — Ht 63.0 in | Wt 162.0 lb

## 2019-08-10 DIAGNOSIS — R0600 Dyspnea, unspecified: Secondary | ICD-10-CM | POA: Diagnosis not present

## 2019-08-10 DIAGNOSIS — R0609 Other forms of dyspnea: Secondary | ICD-10-CM

## 2019-08-10 LAB — MYOCARDIAL PERFUSION IMAGING
Estimated workload: 8.5 METS
Exercise duration (min): 7 min
LV dias vol: 40 mL (ref 46–106)
LV sys vol: 8 mL
MPHR: 141 {beats}/min
Peak HR: 142 {beats}/min
Percent HR: 100 %
RPE: 18
Rest HR: 72 {beats}/min
SDS: 4
SRS: 0
SSS: 4
TID: 0.87

## 2019-08-10 MED ORDER — TECHNETIUM TC 99M TETROFOSMIN IV KIT
10.9000 | PACK | Freq: Once | INTRAVENOUS | Status: AC | PRN
  Administered 2019-08-10: 10.9 via INTRAVENOUS
  Filled 2019-08-10: qty 11

## 2019-08-10 MED ORDER — TECHNETIUM TC 99M TETROFOSMIN IV KIT
31.0000 | PACK | Freq: Once | INTRAVENOUS | Status: AC | PRN
  Administered 2019-08-10: 31 via INTRAVENOUS
  Filled 2019-08-10: qty 31

## 2019-08-23 DIAGNOSIS — Z0100 Encounter for examination of eyes and vision without abnormal findings: Secondary | ICD-10-CM | POA: Diagnosis not present

## 2019-08-24 ENCOUNTER — Other Ambulatory Visit: Payer: Self-pay

## 2019-08-24 ENCOUNTER — Telehealth: Payer: Self-pay | Admitting: Oncology

## 2019-08-24 MED ORDER — TAMOXIFEN CITRATE 20 MG PO TABS: 20.0000 mg | ORAL_TABLET | Freq: Every day | ORAL | 0 refills | Status: DC

## 2019-08-24 NOTE — Telephone Encounter (Signed)
Scheduled appt per 3/18 sch message - pt aware of appt date and time

## 2019-09-06 NOTE — Progress Notes (Signed)
[place]  [phone] Fax:(336) [phone]     ID: Stephanie Liu DOB: [birth year]  MR#: [id number]  FWY#:[id number]  Patient Care Team: Stephanie Infante, MD as PCP - General (Internal Medicine) Stephanie Liu, Stephanie Dad, MD as Consulting Physician (Oncology) Stephanie Lobo, MD as Consulting Physician (Gastroenterology) Stephanie Bookbinder, MD as Consulting Physician (General Surgery) Stephanie Pray, MD as Consulting Physician (Radiation Oncology) Stephanie Maudlin, MD as Consulting Physician (Orthopedic Surgery) Stephanie Beam, MD as Consulting Physician (Neurology) OTHER MD:   CHIEF COMPLAINT: Estrogen receptor positive breast cancer  CURRENT TREATMENT: tamoxifen   INTERVAL HISTORY: Stephanie Liu returns today for follow up of her estrogen receptor positive breast cancer.   She continues on tamoxifen with good tolerance. She denies vaginal discharge, but she reports occasional vaginal dryness, with relief from coconut oil applied externally.  Since her last visit, she underwent bilateral diagnostic mammography with tomography at [facility] on 08/02/2019 showing: breast density category B; no evidence of malignancy in either breast.   She also underwent cardiac scoring CT on 06/28/2019, which showed a coronary calcium score of 643.  She had a subsequent nuclear stress test 08/10/2019 which had to be interrupted because of her pressure going up to 248/90.  She has discussed those results with Stephanie Liu and she is now on rosuvastatin at 20 mg daily.  She is tolerating that well.  She received both doses of her Covid-19 vaccine, on 06/29/2019 and 07/20/2019.   REVIEW OF SYSTEMS: Stephanie Liu asked to walk for exercise, at least 45 minutes a day, frequently 4 miles a day.  She and Stephanie Liu of course go abroad for hiking trips and also to [place] in [place].  She tolerated the vaccine doses without any unusual side effects.  Overall a detailed review of systems today was  stable except as noted   HISTORY OF CURRENT ILLNESS: From the original intake note:  Stephanie Liu had routine screening mammography at [facility] on 07/26/2017 showing breast density category B.  There was a possible abnormality in the right breast. She underwent right breast ultrasonography on 07/30/2017 showing: a 1.3 cm irregular solid mass in there right breast upper outer quadrant highly suggestive of malignancy. An additional mass, possibly a lymph node in the right breast upper outer quadrant posterior depth was read as intermediate suspicion for malignancy.  Accordingly on 08/02/2017 Stephanie Liu proceeded to biopsy of the 1.3 cm right breast mass in question. The pathology from this procedure showed ([id number]):  invasive ductal carcinoma, grade 1, estrogen receptor 95% positive, progesterone receptor 80% positive, with an MIB-1 of 5%, and no HER-2 amplification, the signals ratio being 1.06 and the number per cell 1.90.Marland Kitchen   To further evaluate the second area of concern in the right breast she underwent breast MRI on 08/12/2017 with results showing: Known index cancer over the anterior third of the upper-outer right breast measuring 0.7 x 0.9 x 1.1 cm. Focus of indeterminate non mass enhancement over the posterior third of the outer lower right breast measuring 0.5 x 0.8 x 1.7 cm.  The patient's subsequent history is as detailed below.   PAST MEDICAL HISTORY: Past Medical History:  Diagnosis Date  . CMV infection ([place])   . Collagenous colitis   . Family history of breast cancer   . Family history of melanoma   . Family history of pancreatic cancer   . Family history of prostate cancer   . HTN (hypertension)   . Hypercholesterolemia   . Normal nuclear stress test  03/18/2007  . Normal stress echocardiogram 02/03/2010   RBBB, CLINICALLY NEGATIVE FOR ANGINA, NORMAL LV FUNCTION WITH MILD LVH    PAST SURGICAL HISTORY: Past Surgical History:  Procedure Laterality Date  . APPENDECTOMY    . BREAST  LUMPECTOMY WITH RADIOACTIVE SEED LOCALIZATION Right 09/13/2017   Procedure: RIGHT BREAST LUMPECTOMY WITH RADIOACTIVE SEED LOCALIZATION, AND RIGHT BREAST SEED GUIDED  EXCISIONAL BIOPSY;  Surgeon: Stephanie Bookbinder, MD;  Location: [facility];  Service: General;  Laterality: Right;  . CATARACT EXTRACTION, BILATERAL    . CHOLECYSTECTOMY  1972  . ROTATOR CUFF REPAIR Right   . TOTAL ABDOMINAL HYSTERECTOMY W/ BILATERAL SALPINGOOPHORECTOMY      FAMILY HISTORY Family History  Problem Relation Age of Onset  . Parkinson's disease Mother   . Alzheimer's disease Mother   . Prostate cancer Father        'spread outside of prostate'  . Breast cancer Sister 56  . Heart attack Brother   . Rheum arthritis Paternal Aunt   . Pancreatic cancer Cousin        40's/50's  . Anemia Neg Hx   . Arrhythmia Neg Hx   . Asthma Neg Hx   . Clotting disorder Neg Hx   . Fainting Neg Hx   . Heart disease Neg Hx   . Heart failure Neg Hx   . Hyperlipidemia Neg Hx   . Hypertension Neg Hx   Her father had prostate cancer at age 62. Her mother died from parkinson's and alzheimer at age 12. Her parents were immigrants from [place]. She has 2 sisters and 2 brothers. One of her brothers died last year. There is no other known family history of ovarian or breast cancer, but she has essentially no information extended family   GYNECOLOGIC HISTORY:  No LMP recorded. Patient has had a hysterectomy. 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 Stephanie Liu, her husband is a Film/video editor.  He retired in 2017.  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   Their son, [name], is 17 year old and is a GI Physician in [place], [place].  The patient has has 5 grandchildren     ADVANCED DIRECTIVES: Husband Stephanie Liu is automatically her HCPOA.   HEALTH MAINTENANCE: Social History   Tobacco Use  . Smoking status: Never Smoker  . Smokeless tobacco: Never Used  Substance Use Topics  . Alcohol use: Yes    Alcohol/week: 1.0 standard drinks    Types: 1 Glasses of wine per week    Comment: Occasional  . Drug use: Never     Colonoscopy: UTD  PAP: n/a  Bone density: UTD with Dr. Joylene Draft   Allergies  Allergen Reactions  . Codeine Hives  . Sulfa Antibiotics Rash    Current Outpatient Medications  Medication Sig Dispense Refill  . budesonide (ENTOCORT EC) 3 MG 24 hr capsule Take by mouth daily.    . Cholecalciferol (VITAMIN D3 PO) Take by mouth.    . Coenzyme Q10 (COQ-10) 400 MG CAPS Take 400 mg by mouth daily.    Marland Kitchen ezetimibe (ZETIA) 10 MG tablet Take 10 mg by mouth daily.    Marland Kitchen  metoprolol succinate (TOPROL-XL) 25 MG 24 hr tablet Take 25 mg by mouth daily.    . Probiotic Product (PROBIOTIC DAILY PO) Take by mouth.    . pseudoephedrine-acetaminophen (TYLENOL SINUS) 30-500 MG TABS tablet Take 1 tablet by mouth every 4 (four) hours as needed.    . rosuvastatin (CRESTOR) 10 MG tablet Take 10 mg by mouth at bedtime.    . tamoxifen (NOLVADEX) 20 MG tablet Take 1 tablet (20 mg total) by mouth daily. 90 tablet 0  . triamterene-hydrochlorothiazide (MAXZIDE-25) 37.5-25 MG tablet      No current facility-administered medications for this visit.    OBJECTIVE: white woman who appears younger than stated age  There were no vitals filed for this visit.   There is no height or weight on file to calculate BMI.   Wt Readings from Last 3 Encounters:  08/10/19 162 lb (73.5 kg)  07/25/19 162 lb (73.5 kg)  06/24/18 162 lb 9.6 oz (73.8 kg)      ECOG FS:1 - Symptomatic but completely ambulatory  Sclerae unicteric, EOMs intact Wearing a mask No cervical or  supraclavicular adenopathy Lungs no rales or rhonchi Heart regular rate and rhythm Abd soft, nontender, positive bowel sounds MSK no focal spinal tenderness, no upper extremity lymphedema Neuro: nonfocal, well oriented, appropriate affect Breasts: The right breast is status post lumpectomy and radiation.  The cosmetic result is good.  There is no evidence of local recurrence.  The left breast is benign.  Both axillae are benign.   LAB RESULTS:  CMP     Component Value Date/Time   NA 137 03/14/2018 1421   K 3.8 03/14/2018 1421   CL 101 03/14/2018 1421   CO2 28 03/14/2018 1421   GLUCOSE 104 (H) 03/14/2018 1421   BUN 15 03/14/2018 1421   CREATININE 0.76 03/14/2018 1421   CREATININE 0.84 08/16/2017 1545   CALCIUM 9.0 03/14/2018 1421   PROT 6.8 03/14/2018 1421   ALBUMIN 3.6 03/14/2018 1421   AST 25 03/14/2018 1421   AST 29 08/16/2017 1545   ALT 20 03/14/2018 1421   ALT 38 08/16/2017 1545   ALKPHOS 62 03/14/2018 1421   BILITOT 0.2 (L) 03/14/2018 1421   BILITOT 0.3 08/16/2017 1545   GFRNONAA >60 03/14/2018 1421   GFRNONAA >60 08/16/2017 1545   GFRAA >60 03/14/2018 1421   GFRAA >60 08/16/2017 1545    No results found for: TOTALPROTELP, ALBUMINELP, A1GS, A2GS, BETS, BETA2SER, GAMS, MSPIKE, SPEI  No results found for: KPAFRELGTCHN, LAMBDASER, KAPLAMBRATIO  Lab Results  Component Value Date   WBC 7.3 03/14/2018   NEUTROABS 4.2 03/14/2018   HGB 14.1 03/14/2018   HCT 41.9 03/14/2018   MCV 93.1 03/14/2018   PLT 201 03/14/2018   No results found for: LABCA2  No components found for: TMHDQQ229  No results for input(s): INR in the last 168 hours.  No results found for: LABCA2  No results found for: NLG921  No results found for: JHE174  No results found for: YCX448  No results found for: CA2729  No components found for: HGQUANT  No results found for: CEA1 / No results found for: CEA1   No results found for: AFPTUMOR  No results found for: CHROMOGRNA   No  results found for: HGBA, HGBA2QUANT, HGBFQUANT, HGBSQUAN (Hemoglobinopathy evaluation)   No results found for: LDH  No results found for: IRON, TIBC, IRONPCTSAT (Iron and TIBC)  No results found for: FERRITIN  Urinalysis No results found for: COLORURINE, APPEARANCEUR, [place], [place], GLUCOSEU, [place],  BILIRUBINUR, KETONESUR, PROTEINUR, UROBILINOGEN, NITRITE, LEUKOCYTESUR   STUDIES: Myocardial Perfusion Imaging  Result Date: 08/10/2019  Clinically negative, electrically positive though specificity limited due to baseline changes  Blood pressure demonstrated a hypertensive response to exercise.  Normal perfusion No ischemia  LVEF 81%  Low risk study     ELIGIBLE FOR AVAILABLE RESEARCH PROTOCOL: no  ASSESSMENT: 79 y.o.-year-old [place] woman status post right breast upper outer quadrant biopsy 08/02/2017 for a clinical T1c N0, stage IA invasive ductal carcinoma, grade 1, estrogen and progesterone receptor positive, with an MIB-105% and no HER-2 amplification.  (a) MRI biopsy of second area in the right breast on 08/26/2017 showed ([id number]): a complex sclerosing lesion   (1) status post right lumpectomy without sentinel lymph node sampling 09/13/2017 for a pT1b pNX, stage Ia invasive ductal carcinoma, with negative margins  (2) Oncotype DX score of 17 predicts a risk of outside the breast recurrence over the next 9 years of 5% if the patient's only systemic therapy is antiestrogens for 5 years.  It also predicts no benefit from chemotherapy.  (3) adjuvant radiation 10/25/2017 - 11/22/2017 Site/dose:    1. Right Breast / 40.05 Gy in 15 fractions 2. Right Breast Boost / 10 Gy in 5 fractions  (4) tamoxifen started 11/19/2017  (5) genetics testing 11/29/2017 through [facility] Common Hereditary Cancer Panel + melanoma panel showed: no deleterious mutations in APC, ATM, AXIN2, BAP1, BARD1, BMPR1A, BRCA1, BRCA2, CDH1, CDK4, CDKN2A (p14ARF), CDKN2A (p16INK4a), CHEK2,CTNNA1, DICER1,  EPCAM*, GREM1*, KIT, MEN1, MLH1, MSH2, MSH3, MSH6, MUTYH, NBN, NF1, PALB2, PDGFRA, PMS2, POLD1, POLE, POT1, PTEN, RAD50, RAD51C, RAD51D, RB1, SDHB, SDHC, SDHD, SMAD4, SMARCA4, STK11, TP53, TSC1, TSC2, VHL. The following genes were evaluated for sequence changes only: HOXB13*, MITF*, NTHL1*, SDHA  (a) A variant of uncertain significance (VUS) in  BRIP1 was detected c.1655T>C (p.Ile552Thr).    PLAN: Laylaa is now 2 years out from definitive surgery for her breast cancer with no evidence of disease recurrence.  This is very favorable.  She is tolerating tamoxifen well and the plan is to continue that a minimum of 5 years.  She is doing all the right things as far as her cardiovascular situation is concerned and I am hopeful she will have excellent results.  I commended her exercise program and particularly her hiking and walking program  We discussed her mammogram which shows a low density tissue, making the mammogram more sensitive.  She will see me again in 1 year.  She knows to call for an issue that may develop before then.  Total encounter time 25 minutes.*   Charels Stambaugh, Stephanie Dad, MD  09/07/19 2:34 PM Medical Oncology and Hematology [facility] [place], [place] [address] Tel. [phone]    Fax. [phone]   I, Wilburn Mylar, am acting as scribe for Dr. Virgie Liu. Manuel Lawhead.  I, Lurline Del MD, have reviewed the above documentation for accuracy and completeness, and I agree with the above.   *Total Encounter Time as defined by the Centers for Medicare and Medicaid Services includes, in addition to the face-to-face time of a patient visit (documented in the note above) non-face-to-face time: obtaining and reviewing outside history, ordering and reviewing medications, tests or procedures, care coordination (communications with other health care professionals or caregivers) and documentation in the medical record.

## 2019-09-07 ENCOUNTER — Other Ambulatory Visit: Payer: Self-pay

## 2019-09-07 ENCOUNTER — Inpatient Hospital Stay: Payer: Medicare HMO | Attending: Oncology | Admitting: Oncology

## 2019-09-07 VITALS — BP 136/71 | HR 80 | Temp 97.8°F | Resp 18 | Ht 63.0 in | Wt 165.3 lb

## 2019-09-07 DIAGNOSIS — C50411 Malignant neoplasm of upper-outer quadrant of right female breast: Secondary | ICD-10-CM | POA: Insufficient documentation

## 2019-09-07 DIAGNOSIS — Z79810 Long term (current) use of selective estrogen receptor modulators (SERMs): Secondary | ICD-10-CM | POA: Insufficient documentation

## 2019-09-07 DIAGNOSIS — Z170 Estrogen receptor positive status [ER+]: Secondary | ICD-10-CM | POA: Insufficient documentation

## 2019-09-08 ENCOUNTER — Telehealth: Payer: Self-pay | Admitting: Oncology

## 2019-09-08 NOTE — Telephone Encounter (Signed)
Scheduled appts per 4/1 los. Pt confirmed appt date and time.  

## 2019-09-27 DIAGNOSIS — R69 Illness, unspecified: Secondary | ICD-10-CM | POA: Diagnosis not present

## 2019-10-11 DIAGNOSIS — E7849 Other hyperlipidemia: Secondary | ICD-10-CM | POA: Diagnosis not present

## 2019-11-16 ENCOUNTER — Other Ambulatory Visit: Payer: Self-pay | Admitting: Oncology

## 2020-01-08 DIAGNOSIS — D2261 Melanocytic nevi of right upper limb, including shoulder: Secondary | ICD-10-CM | POA: Diagnosis not present

## 2020-01-08 DIAGNOSIS — D1801 Hemangioma of skin and subcutaneous tissue: Secondary | ICD-10-CM | POA: Diagnosis not present

## 2020-01-08 DIAGNOSIS — L821 Other seborrheic keratosis: Secondary | ICD-10-CM | POA: Diagnosis not present

## 2020-01-08 DIAGNOSIS — D692 Other nonthrombocytopenic purpura: Secondary | ICD-10-CM | POA: Diagnosis not present

## 2020-01-08 DIAGNOSIS — I788 Other diseases of capillaries: Secondary | ICD-10-CM | POA: Diagnosis not present

## 2020-01-15 DIAGNOSIS — H90A22 Sensorineural hearing loss, unilateral, left ear, with restricted hearing on the contralateral side: Secondary | ICD-10-CM | POA: Diagnosis not present

## 2020-01-18 DIAGNOSIS — E785 Hyperlipidemia, unspecified: Secondary | ICD-10-CM | POA: Diagnosis not present

## 2020-01-18 DIAGNOSIS — R69 Illness, unspecified: Secondary | ICD-10-CM | POA: Diagnosis not present

## 2020-01-24 DIAGNOSIS — I10 Essential (primary) hypertension: Secondary | ICD-10-CM | POA: Diagnosis not present

## 2020-01-24 DIAGNOSIS — E785 Hyperlipidemia, unspecified: Secondary | ICD-10-CM | POA: Diagnosis not present

## 2020-01-24 DIAGNOSIS — C50911 Malignant neoplasm of unspecified site of right female breast: Secondary | ICD-10-CM | POA: Diagnosis not present

## 2020-01-24 DIAGNOSIS — I2510 Atherosclerotic heart disease of native coronary artery without angina pectoris: Secondary | ICD-10-CM | POA: Diagnosis not present

## 2020-01-24 DIAGNOSIS — K2970 Gastritis, unspecified, without bleeding: Secondary | ICD-10-CM | POA: Diagnosis not present

## 2020-03-27 DIAGNOSIS — Z23 Encounter for immunization: Secondary | ICD-10-CM | POA: Diagnosis not present

## 2020-04-21 DIAGNOSIS — Z20822 Contact with and (suspected) exposure to covid-19: Secondary | ICD-10-CM | POA: Diagnosis not present

## 2020-06-16 DIAGNOSIS — Z1152 Encounter for screening for COVID-19: Secondary | ICD-10-CM | POA: Diagnosis not present

## 2020-07-30 DIAGNOSIS — E785 Hyperlipidemia, unspecified: Secondary | ICD-10-CM | POA: Diagnosis not present

## 2020-07-30 DIAGNOSIS — I10 Essential (primary) hypertension: Secondary | ICD-10-CM | POA: Diagnosis not present

## 2020-08-02 DIAGNOSIS — I519 Heart disease, unspecified: Secondary | ICD-10-CM | POA: Diagnosis not present

## 2020-08-02 DIAGNOSIS — Z1331 Encounter for screening for depression: Secondary | ICD-10-CM | POA: Diagnosis not present

## 2020-08-02 DIAGNOSIS — R82998 Other abnormal findings in urine: Secondary | ICD-10-CM | POA: Diagnosis not present

## 2020-08-02 DIAGNOSIS — E785 Hyperlipidemia, unspecified: Secondary | ICD-10-CM | POA: Diagnosis not present

## 2020-08-02 DIAGNOSIS — M179 Osteoarthritis of knee, unspecified: Secondary | ICD-10-CM | POA: Diagnosis not present

## 2020-08-02 DIAGNOSIS — I10 Essential (primary) hypertension: Secondary | ICD-10-CM | POA: Diagnosis not present

## 2020-08-02 DIAGNOSIS — C50911 Malignant neoplasm of unspecified site of right female breast: Secondary | ICD-10-CM | POA: Diagnosis not present

## 2020-08-02 DIAGNOSIS — Z23 Encounter for immunization: Secondary | ICD-10-CM | POA: Diagnosis not present

## 2020-08-02 DIAGNOSIS — M5030 Other cervical disc degeneration, unspecified cervical region: Secondary | ICD-10-CM | POA: Diagnosis not present

## 2020-08-02 DIAGNOSIS — R011 Cardiac murmur, unspecified: Secondary | ICD-10-CM | POA: Diagnosis not present

## 2020-08-02 DIAGNOSIS — I2510 Atherosclerotic heart disease of native coronary artery without angina pectoris: Secondary | ICD-10-CM | POA: Diagnosis not present

## 2020-08-02 DIAGNOSIS — Z0000 Encounter for general adult medical examination without abnormal findings: Secondary | ICD-10-CM | POA: Diagnosis not present

## 2020-08-02 DIAGNOSIS — R809 Proteinuria, unspecified: Secondary | ICD-10-CM | POA: Diagnosis not present

## 2020-08-14 DIAGNOSIS — H52203 Unspecified astigmatism, bilateral: Secondary | ICD-10-CM | POA: Diagnosis not present

## 2020-08-14 DIAGNOSIS — Z961 Presence of intraocular lens: Secondary | ICD-10-CM | POA: Diagnosis not present

## 2020-08-23 DIAGNOSIS — Z853 Personal history of malignant neoplasm of breast: Secondary | ICD-10-CM | POA: Diagnosis not present

## 2020-09-09 ENCOUNTER — Telehealth: Payer: Self-pay | Admitting: Oncology

## 2020-09-09 NOTE — Telephone Encounter (Signed)
Rescheduled per 4/4 sch msg pt request. Called and spoke with pt, confirmed 4/15 appt

## 2020-09-10 ENCOUNTER — Telehealth: Payer: Self-pay | Admitting: Oncology

## 2020-09-10 NOTE — Telephone Encounter (Signed)
Scheduled per 4/5 staff msg. Called pt and left a msg

## 2020-09-11 ENCOUNTER — Inpatient Hospital Stay: Payer: Medicare HMO

## 2020-09-11 ENCOUNTER — Inpatient Hospital Stay: Payer: Medicare HMO | Admitting: Oncology

## 2020-09-18 DIAGNOSIS — M205X2 Other deformities of toe(s) (acquired), left foot: Secondary | ICD-10-CM | POA: Diagnosis not present

## 2020-09-18 DIAGNOSIS — M21612 Bunion of left foot: Secondary | ICD-10-CM | POA: Diagnosis not present

## 2020-09-18 DIAGNOSIS — M7752 Other enthesopathy of left foot: Secondary | ICD-10-CM | POA: Diagnosis not present

## 2020-09-18 DIAGNOSIS — M21622 Bunionette of left foot: Secondary | ICD-10-CM | POA: Diagnosis not present

## 2020-09-20 ENCOUNTER — Inpatient Hospital Stay: Payer: Medicare HMO | Admitting: Oncology

## 2020-09-20 ENCOUNTER — Inpatient Hospital Stay: Payer: Medicare HMO

## 2020-10-03 DIAGNOSIS — K52831 Collagenous colitis: Secondary | ICD-10-CM | POA: Diagnosis not present

## 2020-10-23 ENCOUNTER — Telehealth: Payer: Self-pay | Admitting: *Deleted

## 2020-10-23 DIAGNOSIS — Z170 Estrogen receptor positive status [ER+]: Secondary | ICD-10-CM

## 2020-10-23 NOTE — Telephone Encounter (Signed)
No entry 

## 2020-10-23 NOTE — Progress Notes (Signed)
[place]  [phone] Fax:(336) [phone]     ID: AFREEN SIEBELS DOB: 12-12-1940  MR#: [id number]  TIR#:[id number]  Patient Care Team: Crist Infante, MD as PCP - General (Internal Medicine) Kasara Schomer, Virgie Dad, MD as Consulting Physician (Oncology) Ronald Lobo, MD as Consulting Physician (Gastroenterology) Rolm Bookbinder, MD as Consulting Physician (General Surgery) Gery Pray, MD as Consulting Physician (Radiation Oncology) Latanya Maudlin, MD as Consulting Physician (Orthopedic Surgery) Melvenia Beam, MD as Consulting Physician (Neurology) OTHER MD:   CHIEF COMPLAINT: Estrogen receptor positive breast cancer  CURRENT TREATMENT: tamoxifen   INTERVAL HISTORY: Shonette returns today for follow up of her estrogen receptor positive breast cancer.   She continues on tamoxifen with good tolerance. She denies vaginal discharge or significant problems with hot flashes.  She has had no other complications from this.  Since her last visit, she underwent bilateral diagnostic mammography with tomography at [facility] on 08/23/2020 showing: breast density category B; no evidence of malignancy in either breast.    REVIEW OF SYSTEMS: Junnie exercises by walking and she tells me she walks between 1 and 3 miles most days.  She and her husband time are downsizing and [place].  They are doing quite a bit of gardening as a result and getting the house fixed to sell.  A detailed review of systems to say was otherwise stable   COVID 19 VACCINATION STATUS: [place] x2, most recently 07/2019   HISTORY OF CURRENT ILLNESS: From the original intake note:  Abigaelle had routine screening mammography at [facility] on 07/26/2017 showing breast density category B.  There was a possible abnormality in the right breast. She underwent right breast ultrasonography on 07/30/2017 showing: a 1.3 cm irregular solid mass in there right breast upper outer quadrant highly  suggestive of malignancy. An additional mass, possibly a lymph node in the right breast upper outer quadrant posterior depth was read as intermediate suspicion for malignancy.  Accordingly on 08/02/2017 Hideko proceeded to biopsy of the 1.3 cm right breast mass in question. The pathology from this procedure showed ([id number]):  invasive ductal carcinoma, grade 1, estrogen receptor 95% positive, progesterone receptor 80% positive, with an MIB-1 of 5%, and no HER-2 amplification, the signals ratio being 1.06 and the number per cell 1.90.Marland Kitchen   To further evaluate the second area of concern in the right breast she underwent breast MRI on 08/12/2017 with results showing: Known index cancer over the anterior third of the upper-outer right breast measuring 0.7 x 0.9 x 1.1 cm. Focus of indeterminate non mass enhancement over the posterior third of the outer lower right breast measuring 0.5 x 0.8 x 1.7 cm.  The patient's subsequent history is as detailed below.   PAST MEDICAL HISTORY: Past Medical History:  Diagnosis Date  . CMV infection ([place])   . Collagenous colitis   . Family history of breast cancer   . Family history of melanoma   . Family history of pancreatic cancer   . Family history of prostate cancer   . HTN (hypertension)   . Hypercholesterolemia   . Normal nuclear stress test 03/18/2007  . Normal stress echocardiogram 02/03/2010   RBBB, CLINICALLY NEGATIVE FOR ANGINA, NORMAL LV FUNCTION WITH MILD LVH    PAST SURGICAL HISTORY: Past Surgical History:  Procedure Laterality Date  . APPENDECTOMY    . BREAST LUMPECTOMY WITH RADIOACTIVE SEED LOCALIZATION Right 09/13/2017   Procedure: RIGHT BREAST LUMPECTOMY WITH RADIOACTIVE SEED LOCALIZATION, AND RIGHT BREAST SEED GUIDED  EXCISIONAL BIOPSY;  Surgeon: Rolm Bookbinder, MD;  Location: [facility];  Service: General;  Laterality: Right;  . CATARACT EXTRACTION, BILATERAL    . CHOLECYSTECTOMY  1972  . ROTATOR CUFF REPAIR Right   .  TOTAL ABDOMINAL HYSTERECTOMY W/ BILATERAL SALPINGOOPHORECTOMY      FAMILY HISTORY Family History  Problem Relation Age of Onset  . Parkinson's disease Mother   . Alzheimer's disease Mother   . Prostate cancer Father        'spread outside of prostate'  . Breast cancer Sister 2  . Heart attack Brother   . Rheum arthritis Paternal Aunt   . Pancreatic cancer Cousin        40's/50's  . Anemia Neg Hx   . Arrhythmia Neg Hx   . Asthma Neg Hx   . Clotting disorder Neg Hx   . Fainting Neg Hx   . Heart disease Neg Hx   . Heart failure Neg Hx   . Hyperlipidemia Neg Hx   . Hypertension Neg Hx   Her father had prostate cancer at age 80. Her mother died from parkinson's and alzheimer at age 27. Her parents were immigrants from [place]. She has 2 sisters and 2 brothers. One of her brothers died last year. There is no other known family history of ovarian or breast cancer, but she has essentially no information extended family   GYNECOLOGIC HISTORY:  No LMP recorded. Patient has had a hysterectomy. 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 [name], her husband is a Film/video editor.  He retired in 2017.  Their older child is [name], who lives in [place] with 3 children; she is a Music therapist and Minister's wife.  Their son, [name], is 66 year old and is a GI Physician in [place], [place].  The patient has has 5 grandchildren     ADVANCED DIRECTIVES: Husband [name] is automatically her HCPOA.   HEALTH MAINTENANCE: Social History   Tobacco Use  . Smoking status: Never Smoker  . Smokeless tobacco: Never Used  Vaping Use  . Vaping Use: Never used  Substance Use Topics  . Alcohol use: Yes     Alcohol/week: 1.0 standard drink    Types: 1 Glasses of wine per week    Comment: Occasional  . Drug use: Never     Colonoscopy: UTD  PAP: n/a  Bone density: UTD with Dr. Joylene Draft   Allergies  Allergen Reactions  . Codeine Hives  . Sulfa Antibiotics Rash    Current Outpatient Medications  Medication Sig Dispense Refill  . budesonide (ENTOCORT EC) 3 MG 24 hr capsule Take by mouth daily.    . Cholecalciferol (VITAMIN D3 PO) Take by mouth.    . Coenzyme Q10 (COQ-10) 400 MG CAPS Take 400 mg by mouth daily.    Marland Kitchen ezetimibe (ZETIA) 10 MG tablet Take 10 mg by mouth daily.    . metoprolol succinate (TOPROL-XL) 25 MG 24 hr tablet Take 25 mg by mouth daily.    . Probiotic Product (PROBIOTIC DAILY PO) Take by mouth.    . pseudoephedrine-acetaminophen (TYLENOL SINUS) 30-500 MG TABS tablet Take 1 tablet by mouth every 4 (four) hours as needed.    . rosuvastatin (CRESTOR)  10 MG tablet Take 10 mg by mouth at bedtime.    . tamoxifen (NOLVADEX) 20 MG tablet TAKE 1 TABLET BY MOUTH EVERY DAY 90 tablet 3  . triamterene-hydrochlorothiazide (MAXZIDE-25) 37.5-25 MG tablet      No current facility-administered medications for this visit.    OBJECTIVE: white woman who appears younger than stated age  44:   10/24/20 1503  BP: (!) 142/65  Pulse: 79  Resp: 17  Temp: (!) 97 F (36.1 C)  SpO2: 96%     Body mass index is 28.36 kg/m.   Wt Readings from Last 3 Encounters:  10/24/20 160 lb 1.6 oz (72.6 kg)  09/07/19 165 lb 4.8 oz (75 kg)  08/10/19 162 lb (73.5 kg)      ECOG FS:1 - Symptomatic but completely ambulatory  Sclerae unicteric, EOMs intact Wearing a mask No cervical or supraclavicular adenopathy Lungs no rales or rhonchi Heart regular rate and rhythm Abd soft, nontender, positive bowel sounds MSK no focal spinal tenderness, no upper extremity lymphedema Neuro: nonfocal, well oriented, appropriate affect Breasts: The right breast has undergone lumpectomy and radiation.  There is  no evidence of local recurrence per the left breast and both axillae are benign.   LAB RESULTS:  CMP     Component Value Date/Time   NA 143 10/24/2020 1449   K 4.3 10/24/2020 1449   CL 101 10/24/2020 1449   CO2 31 10/24/2020 1449   GLUCOSE 126 (H) 10/24/2020 1449   BUN 22 10/24/2020 1449   CREATININE 0.98 10/24/2020 1449   CALCIUM 9.2 10/24/2020 1449   PROT 6.9 10/24/2020 1449   ALBUMIN 3.7 10/24/2020 1449   AST 22 10/24/2020 1449   ALT 16 10/24/2020 1449   ALKPHOS 44 10/24/2020 1449   BILITOT 0.4 10/24/2020 1449   GFRNONAA 58 (L) 10/24/2020 1449   GFRAA >60 03/14/2018 1421   GFRAA >60 08/16/2017 1545    No results found for: TOTALPROTELP, ALBUMINELP, A1GS, A2GS, BETS, BETA2SER, GAMS, MSPIKE, SPEI  No results found for: KPAFRELGTCHN, LAMBDASER, KAPLAMBRATIO  Lab Results  Component Value Date   WBC 8.1 10/24/2020   NEUTROABS 4.5 10/24/2020   HGB 14.3 10/24/2020   HCT 42.7 10/24/2020   MCV 94.1 10/24/2020   PLT 213 10/24/2020   No results found for: LABCA2  No components found for: QQIWLN989  No results for input(s): INR in the last 168 hours.  No results found for: LABCA2  No results found for: QJJ941  No results found for: DEY814  No results found for: GYJ856  No results found for: CA2729  No components found for: HGQUANT  No results found for: CEA1 / No results found for: CEA1   No results found for: AFPTUMOR  No results found for: CHROMOGRNA   No results found for: HGBA, HGBA2QUANT, HGBFQUANT, HGBSQUAN (Hemoglobinopathy evaluation)   No results found for: LDH  No results found for: IRON, TIBC, IRONPCTSAT (Iron and TIBC)  No results found for: FERRITIN  Urinalysis No results found for: COLORURINE, APPEARANCEUR, LABSPEC, PHURINE, GLUCOSEU, HGBUR, BILIRUBINUR, KETONESUR, PROTEINUR, UROBILINOGEN, NITRITE, LEUKOCYTESUR   STUDIES: No results found.   ELIGIBLE FOR AVAILABLE RESEARCH PROTOCOL: no  ASSESSMENT: 80 y.o.-year-old [place]  woman status post right breast upper outer quadrant biopsy 08/02/2017 for a clinical T1c N0, stage IA invasive ductal carcinoma, grade 1, estrogen and progesterone receptor positive, with an MIB-105% and no HER-2 amplification.  (a) MRI biopsy of second area in the right breast on 08/26/2017 showed ([id number]): a complex sclerosing lesion   (  1) status post right lumpectomy without sentinel lymph node sampling 09/13/2017 for a pT1b pNX, stage Ia invasive ductal carcinoma, with negative margins  (2) Oncotype DX score of 17 predicts a risk of outside the breast recurrence over the next 9 years of 5% if the patient's only systemic therapy is antiestrogens for 5 years.  It also predicts no benefit from chemotherapy.  (3) adjuvant radiation 10/25/2017 - 11/22/2017 Site/dose:    1. Right Breast / 40.05 Gy in 15 fractions 2. Right Breast Boost / 10 Gy in 5 fractions  (4) tamoxifen started 11/19/2017  (5) genetics testing 11/29/2017 through [facility] Common Hereditary Cancer Panel + melanoma panel showed: no deleterious mutations in APC, ATM, AXIN2, BAP1, BARD1, BMPR1A, BRCA1, BRCA2, CDH1, CDK4, CDKN2A (p14ARF), CDKN2A (p16INK4a), CHEK2,CTNNA1, DICER1, EPCAM*, GREM1*, KIT, MEN1, MLH1, MSH2, MSH3, MSH6, MUTYH, NBN, NF1, PALB2, PDGFRA, PMS2, POLD1, POLE, POT1, PTEN, RAD50, RAD51C, RAD51D, RB1, SDHB, SDHC, SDHD, SMAD4, SMARCA4, STK11, TP53, TSC1, TSC2, VHL. The following genes were evaluated for sequence changes only: HOXB13*, MITF*, NTHL1*, SDHA  (a) A variant of uncertain significance (VUS) in  BRIP1 was detected c.1655T>C (p.Ile552Thr).    PLAN: Ivelis is now just over 3 years out from definitive surgery for her breast cancer with no evidence of disease recurrence.  This is very favorable.  She is tolerating tamoxifen well and the plan is to continue that a total of 5 years.  She tells me at some point she and [name] may moved to [facility] where her son and her son's in-laws live and which  is a lot closer to her daughter in [place].  For now though they are going to be at [facility] and they are looking forward to the move.  I commended her exercise program.  She will return in 1 year after her mammography.  She knows to call for any other issues that may develop before that  Total encounter time 20 minutes.*  Keilee Denman, Virgie Dad, MD  10/24/20 5:48 PM Medical Oncology and Hematology [facility] [place], [place] [address] Tel. [phone]    Fax. [phone]   I, Wilburn Mylar, am acting as scribe for Dr. Virgie Dad. Jimma Ortman.  I, Lurline Del MD, have reviewed the above documentation for accuracy and completeness, and I agree with the above.   *Total Encounter Time as defined by the Centers for Medicare and Medicaid Services includes, in addition to the face-to-face time of a patient visit (documented in the note above) non-face-to-face time: obtaining and reviewing outside history, ordering and reviewing medications, tests or procedures, care coordination (communications with other health care professionals or caregivers) and documentation in the medical record.

## 2020-10-24 ENCOUNTER — Telehealth: Payer: Self-pay | Admitting: Oncology

## 2020-10-24 ENCOUNTER — Inpatient Hospital Stay: Payer: Medicare HMO | Admitting: Oncology

## 2020-10-24 ENCOUNTER — Other Ambulatory Visit: Payer: Self-pay

## 2020-10-24 ENCOUNTER — Inpatient Hospital Stay: Payer: Medicare HMO | Attending: Oncology

## 2020-10-24 VITALS — BP 142/65 | HR 79 | Temp 97.0°F | Resp 17 | Wt 160.1 lb

## 2020-10-24 DIAGNOSIS — Z923 Personal history of irradiation: Secondary | ICD-10-CM | POA: Diagnosis not present

## 2020-10-24 DIAGNOSIS — C50411 Malignant neoplasm of upper-outer quadrant of right female breast: Secondary | ICD-10-CM | POA: Insufficient documentation

## 2020-10-24 DIAGNOSIS — Z170 Estrogen receptor positive status [ER+]: Secondary | ICD-10-CM | POA: Insufficient documentation

## 2020-10-24 LAB — CBC WITH DIFFERENTIAL (CANCER CENTER ONLY)
Abs Immature Granulocytes: 0.01 10*3/uL (ref 0.00–0.07)
Basophils Absolute: 0.1 10*3/uL (ref 0.0–0.1)
Basophils Relative: 1 %
Eosinophils Absolute: 0.1 10*3/uL (ref 0.0–0.5)
Eosinophils Relative: 2 %
HCT: 42.7 % (ref 36.0–46.0)
Hemoglobin: 14.3 g/dL (ref 12.0–15.0)
Immature Granulocytes: 0 %
Lymphocytes Relative: 35 %
Lymphs Abs: 2.8 10*3/uL (ref 0.7–4.0)
MCH: 31.5 pg (ref 26.0–34.0)
MCHC: 33.5 g/dL (ref 30.0–36.0)
MCV: 94.1 fL (ref 80.0–100.0)
Monocytes Absolute: 0.5 10*3/uL (ref 0.1–1.0)
Monocytes Relative: 7 %
Neutro Abs: 4.5 10*3/uL (ref 1.7–7.7)
Neutrophils Relative %: 55 %
Platelet Count: 213 10*3/uL (ref 150–400)
RBC: 4.54 MIL/uL (ref 3.87–5.11)
RDW: 12.4 % (ref 11.5–15.5)
WBC Count: 8.1 10*3/uL (ref 4.0–10.5)
nRBC: 0 % (ref 0.0–0.2)

## 2020-10-24 LAB — CMP (CANCER CENTER ONLY)
ALT: 16 U/L (ref 0–44)
AST: 22 U/L (ref 15–41)
Albumin: 3.7 g/dL (ref 3.5–5.0)
Alkaline Phosphatase: 44 U/L (ref 38–126)
Anion gap: 11 (ref 5–15)
BUN: 22 mg/dL (ref 8–23)
CO2: 31 mmol/L (ref 22–32)
Calcium: 9.2 mg/dL (ref 8.9–10.3)
Chloride: 101 mmol/L (ref 98–111)
Creatinine: 0.98 mg/dL (ref 0.44–1.00)
GFR, Estimated: 58 mL/min — ABNORMAL LOW (ref >60)
Glucose, Bld: 126 mg/dL — ABNORMAL HIGH (ref 70–99)
Potassium: 4.3 mmol/L (ref 3.5–5.1)
Sodium: 143 mmol/L (ref 135–145)
Total Bilirubin: 0.4 mg/dL (ref 0.3–1.2)
Total Protein: 6.9 g/dL (ref 6.5–8.1)

## 2020-10-24 NOTE — Telephone Encounter (Signed)
Scheduled appointment per 05/19 los. Patient is aware. 

## 2020-11-30 ENCOUNTER — Other Ambulatory Visit: Payer: Self-pay | Admitting: Oncology

## 2021-01-14 DIAGNOSIS — E785 Hyperlipidemia, unspecified: Secondary | ICD-10-CM | POA: Diagnosis not present

## 2021-02-27 DIAGNOSIS — L603 Nail dystrophy: Secondary | ICD-10-CM | POA: Diagnosis not present

## 2021-02-27 DIAGNOSIS — D2262 Melanocytic nevi of left upper limb, including shoulder: Secondary | ICD-10-CM | POA: Diagnosis not present

## 2021-02-27 DIAGNOSIS — D2261 Melanocytic nevi of right upper limb, including shoulder: Secondary | ICD-10-CM | POA: Diagnosis not present

## 2021-02-27 DIAGNOSIS — I788 Other diseases of capillaries: Secondary | ICD-10-CM | POA: Diagnosis not present

## 2021-02-27 DIAGNOSIS — D1801 Hemangioma of skin and subcutaneous tissue: Secondary | ICD-10-CM | POA: Diagnosis not present

## 2021-02-27 DIAGNOSIS — D692 Other nonthrombocytopenic purpura: Secondary | ICD-10-CM | POA: Diagnosis not present

## 2021-02-27 DIAGNOSIS — L821 Other seborrheic keratosis: Secondary | ICD-10-CM | POA: Diagnosis not present

## 2021-03-15 DIAGNOSIS — Z23 Encounter for immunization: Secondary | ICD-10-CM | POA: Diagnosis not present

## 2021-03-20 DIAGNOSIS — M79605 Pain in left leg: Secondary | ICD-10-CM | POA: Diagnosis not present

## 2021-03-20 DIAGNOSIS — M25562 Pain in left knee: Secondary | ICD-10-CM | POA: Diagnosis not present

## 2021-08-15 DIAGNOSIS — Z780 Asymptomatic menopausal state: Secondary | ICD-10-CM | POA: Diagnosis not present

## 2021-08-15 DIAGNOSIS — E785 Hyperlipidemia, unspecified: Secondary | ICD-10-CM | POA: Diagnosis not present

## 2021-08-15 DIAGNOSIS — D7589 Other specified diseases of blood and blood-forming organs: Secondary | ICD-10-CM | POA: Diagnosis not present

## 2021-08-15 DIAGNOSIS — I10 Essential (primary) hypertension: Secondary | ICD-10-CM | POA: Diagnosis not present

## 2021-08-20 DIAGNOSIS — H353121 Nonexudative age-related macular degeneration, left eye, early dry stage: Secondary | ICD-10-CM | POA: Diagnosis not present

## 2021-08-20 DIAGNOSIS — Z961 Presence of intraocular lens: Secondary | ICD-10-CM | POA: Diagnosis not present

## 2021-08-20 DIAGNOSIS — H52203 Unspecified astigmatism, bilateral: Secondary | ICD-10-CM | POA: Diagnosis not present

## 2021-08-22 DIAGNOSIS — M5030 Other cervical disc degeneration, unspecified cervical region: Secondary | ICD-10-CM | POA: Diagnosis not present

## 2021-08-22 DIAGNOSIS — M179 Osteoarthritis of knee, unspecified: Secondary | ICD-10-CM | POA: Diagnosis not present

## 2021-08-22 DIAGNOSIS — R82998 Other abnormal findings in urine: Secondary | ICD-10-CM | POA: Diagnosis not present

## 2021-08-22 DIAGNOSIS — Z1339 Encounter for screening examination for other mental health and behavioral disorders: Secondary | ICD-10-CM | POA: Diagnosis not present

## 2021-08-22 DIAGNOSIS — I2510 Atherosclerotic heart disease of native coronary artery without angina pectoris: Secondary | ICD-10-CM | POA: Diagnosis not present

## 2021-08-22 DIAGNOSIS — I10 Essential (primary) hypertension: Secondary | ICD-10-CM | POA: Diagnosis not present

## 2021-08-22 DIAGNOSIS — I519 Heart disease, unspecified: Secondary | ICD-10-CM | POA: Diagnosis not present

## 2021-08-22 DIAGNOSIS — E785 Hyperlipidemia, unspecified: Secondary | ICD-10-CM | POA: Diagnosis not present

## 2021-08-22 DIAGNOSIS — Z1331 Encounter for screening for depression: Secondary | ICD-10-CM | POA: Diagnosis not present

## 2021-08-22 DIAGNOSIS — R7301 Impaired fasting glucose: Secondary | ICD-10-CM | POA: Diagnosis not present

## 2021-08-22 DIAGNOSIS — Z0000 Encounter for general adult medical examination without abnormal findings: Secondary | ICD-10-CM | POA: Diagnosis not present

## 2021-08-22 DIAGNOSIS — C50911 Malignant neoplasm of unspecified site of right female breast: Secondary | ICD-10-CM | POA: Diagnosis not present

## 2021-08-27 DIAGNOSIS — Z0100 Encounter for examination of eyes and vision without abnormal findings: Secondary | ICD-10-CM | POA: Diagnosis not present

## 2021-08-27 DIAGNOSIS — Z1231 Encounter for screening mammogram for malignant neoplasm of breast: Secondary | ICD-10-CM | POA: Diagnosis not present

## 2021-09-08 NOTE — Progress Notes (Signed)
? ?Patient Care Team: ?Crist Infante, MD as PCP - General (Internal Medicine) ?Magrinat, Virgie Dad, MD (Inactive) as Consulting Physician (Oncology) ?Ronald Lobo, MD as Consulting Physician (Gastroenterology) ?Rolm Bookbinder, MD as Consulting Physician (General Surgery) ?Gery Pray, MD as Consulting Physician (Radiation Oncology) ?Latanya Maudlin, MD as Consulting Physician (Orthopedic Surgery) ?Melvenia Beam, MD as Consulting Physician (Neurology) ? ?DIAGNOSIS:  ?Encounter Diagnosis  ?Name Primary?  ? Malignant neoplasm of upper-outer quadrant of right breast in female, estrogen receptor positive ([place])   ? ? ?SUMMARY OF ONCOLOGIC HISTORY: ?Oncology History  ?Malignant neoplasm of upper-outer quadrant of right breast in female, estrogen receptor positive ([place])  ?08/02/2017 Initial Diagnosis  ? status post right breast upper outer quadrant biopsy for a clinical T1c N0, stage IA invasive ductal carcinoma, grade 1, estrogen and progesterone receptor positive, with an MIB-5% and no HER-2 amplification. ?            (a) MRI biopsy of second area in the right breast on 08/26/2017 showed ([id number]): a complex sclerosing lesion  ?  ?  ?09/13/2017 Surgery  ? status post right lumpectomy without sentinel lymph node sampling 09/13/2017 for a pT1b pNX, stage Ia invasive ductal carcinoma, with negative margins ?  ?09/13/2017 Oncotype testing  ?  17 predicts a risk of outside the breast recurrence over the next 9 years of 5% if the patient's only systemic therapy is antiestrogens for 5 years.  It also predicts no benefit from chemotherapy. ?  ?10/25/2017 - 11/22/2017 Radiation Therapy  ? adjuvant radiation 10/25/2017 - 11/22/2017 ?Site/dose:    ?1. Right Breast / 40.05 Gy in 15 fractions ?2. Right Breast Boost / 10 Gy in 5 fractions ?  ?11/29/2017 Genetic Testing  ? The Common Heredtiary [place] Panel + melanoma panel was ordered (51 genes). The following genes were evaluated for sequence changes and exonic  deletions/duplications:APC, ATM, AXIN2, BAP1, BARD1, BMPR1A, BRCA1, BRCA2, BRIP1, CDH1, CDK4, CDKN2A (p14ARF), CDKN2A (p16INK4a), CHEK2,CTNNA1, DICER1, EPCAM*, GREM1*, KIT, MEN1, MLH1, MSH2, MSH3, MSH6, MUTYH, NBN, NF1, PALB2, PDGFRA, PMS2, POLD1, POLE, POT1, PTEN, RAD50, RAD51C, RAD51D, RB1, SDHB, SDHC, SDHD, SMAD4, SMARCA4, STK11, TP53, TSC1, TSC2, VHL. The following genes were evaluated for sequence changes only: ?HOXB13*, MITF*, NTHL1*, SDHA ? ?Results: No pathogenic variants identified.  A variant of uncertain significance in the gene BRIP1 was detected c.1655T>C (p.Ile552Thr).  The date of this test report is 11/29/2017.  ?  ?11/2017 -  Anti-estrogen oral therapy  ? Tamoxifen daily ?  ? ? ?CHIEF COMPLIANT: Estrogen receptor positive breast cancer on Tamoxifen  ? ?INTERVAL HISTORY: JERALYNN VAQUERA is a 81 y.o. with the above mention Estrogen receptor positive breast cancer. She presents to the clinic today for a follow-up. She tolerating the tamoxifen. Her fluid intake. Complain of some bone pain. ? ? ?ALLERGIES:  is allergic to codeine and sulfa antibiotics. ? ?MEDICATIONS:  ?Current Outpatient Medications  ?Medication Sig Dispense Refill  ? Evolocumab (REPATHA) 140 MG/ML SOSY Inject 140 mg into the skin every 14 (fourteen) days. 2.1 mL   ? budesonide (ENTOCORT EC) 3 MG 24 hr capsule Take by mouth daily.    ? Cholecalciferol (VITAMIN D3 PO) Take by mouth.    ? metoprolol succinate (TOPROL-XL) 25 MG 24 hr tablet Take 25 mg by mouth daily.    ? Probiotic Product (PROBIOTIC DAILY PO) Take by mouth.    ? pseudoephedrine-acetaminophen (TYLENOL SINUS) 30-500 MG TABS tablet Take 1 tablet by mouth every 4 (four) hours as needed.    ?  tamoxifen (NOLVADEX) 20 MG tablet Take 1 tablet (20 mg total) by mouth daily. 90 tablet 4  ? triamterene-hydrochlorothiazide (MAXZIDE-25) 37.5-25 MG tablet     ? ?No current facility-administered medications for this visit.  ? ? ?PHYSICAL EXAMINATION: ?ECOG PERFORMANCE STATUS: 1 -  Symptomatic but completely ambulatory ? ?Vitals:  ? 09/09/21 1411  ?BP: (!) 144/77  ?Pulse: 80  ?Resp: 17  ?Temp: 97.8 ?F (36.6 ?C)  ?SpO2: 97%  ? ?Filed Weights  ? 09/09/21 1411  ?Weight: 160 lb 6.4 oz (72.8 kg)  ? ? ?BREAST: No palpable masses or nodules in either right or left breasts. No palpable axillary supraclavicular or infraclavicular adenopathy no breast tenderness or nipple discharge. (exam performed in the presence of a chaperone) ? ?LABORATORY DATA:  ?I have reviewed the data as listed ? ?  Latest Ref Rng & Units 10/24/2020  ?  2:49 PM 03/14/2018  ?  2:21 PM 08/16/2017  ?  3:45 PM  ?CMP  ?Glucose 70 - 99 mg/dL 126   104   98    ?BUN 8 - 23 mg/dL _0 ?Creatinine 0.44 - 1.00 mg/dL 0.98   0.76   0.84    ?Sodium 135 - 145 mmol/L 143   137   136    ?Potassium 3.5 - 5.1 mmol/L 4.3   3.8   4.8    ?Chloride 98 - 111 mmol/L 101   101   99    ?CO2 22 - 32 mmol/L _1 ?Calcium 8.9 - 10.3 mg/dL 9.2   9.0   9.8    ?Total Protein 6.5 - 8.1 g/dL 6.9   6.8   7.4    ?Total Bilirubin 0.3 - 1.2 mg/dL 0.4   0.2   0.3    ?Alkaline Phos 38 - 126 U/L 44   62   57    ?AST 15 - 41 U/L _2 ?ALT 0 - 44 U/L 16   20   38    ? ? ?Lab Results  ?Component Value Date  ? WBC 8.1 10/24/2020  ? HGB 14.3 10/24/2020  ? HCT 42.7 10/24/2020  ? MCV 94.1 10/24/2020  ? PLT 213 10/24/2020  ? NEUTROABS 4.5 10/24/2020  ? ? ?ASSESSMENT & PLAN:  ?Malignant neoplasm of upper-outer quadrant of right breast in female, estrogen receptor positive ([place]) ?08/02/2017: Right breast biopsy: T1CN0 stage Ia grade 1 IDC ER/PR positive HER2 negative, Ki-67 10% ?08/26/2017: MRI biopsy second area right breast: Complex sclerosing lesion ?09/13/2017: Right lumpectomy: T1BNX stage Ia IDC negative margins, Oncotype: 17 (5% risk of recurrence) ?10/25/2017- 11/22/2017: Adjuvant radiation ?11/29/2017: Genetics: Negative, (VUS) in  BRIP1  ? ?Current treatment: Tamoxifen started 11/19/2017 with plan for 5 years of therapy ?Tamoxifen toxicities:  Denies any adverse effects to tamoxifen ?Occasional muscle cramps ? ?Breast cancer surveillance: ?1.  Breast exam 09/09/2021: Benign ?2. Mammogram 08/27/2021: Benign ? ?Return to clinic in 1 year for follow-up ? ? ? ?No orders of the defined types were placed in this encounter. ? ?The patient has a good understanding of the overall plan. she agrees with it. she will call with any problems that may develop before the next visit here. ?Total time spent: 30 mins including face to face time and time spent for planning, charting and co-ordination of care ? ? Harriette Ohara, MD ?09/09/21 ? ? ? I  Gardiner Coins am scribing for Dr. Lindi Adie ? ?I have reviewed the above documentation for accuracy and completeness, and I agree with the above. ?  ?

## 2021-09-09 ENCOUNTER — Inpatient Hospital Stay: Payer: Medicare HMO | Attending: Hematology and Oncology | Admitting: Hematology and Oncology

## 2021-09-09 ENCOUNTER — Other Ambulatory Visit: Payer: Self-pay

## 2021-09-09 DIAGNOSIS — Z170 Estrogen receptor positive status [ER+]: Secondary | ICD-10-CM | POA: Diagnosis not present

## 2021-09-09 DIAGNOSIS — Z79810 Long term (current) use of selective estrogen receptor modulators (SERMs): Secondary | ICD-10-CM | POA: Insufficient documentation

## 2021-09-09 DIAGNOSIS — C50411 Malignant neoplasm of upper-outer quadrant of right female breast: Secondary | ICD-10-CM | POA: Insufficient documentation

## 2021-09-09 MED ORDER — REPATHA 140 MG/ML ~~LOC~~ SOSY
140.0000 mg | PREFILLED_SYRINGE | SUBCUTANEOUS | Status: AC
Start: 2021-09-09 — End: ?

## 2021-09-09 MED ORDER — TAMOXIFEN CITRATE 20 MG PO TABS
20.0000 mg | ORAL_TABLET | Freq: Every day | ORAL | 4 refills | Status: DC
Start: 2021-09-09 — End: 2022-11-03

## 2021-09-09 NOTE — Assessment & Plan Note (Addendum)
08/02/2017: Right breast biopsy: T1CN0 stage Ia grade 1 IDC ER/PR positive HER2 negative, Ki-67 10% ?08/26/2017: MRI biopsy second area right breast: Complex sclerosing lesion ?09/13/2017: Right lumpectomy: T1BNX stage Ia IDC negative margins, Oncotype: 17 (5% risk of recurrence) ?10/25/2017- 11/22/2017: Adjuvant radiation ?11/29/2017: Genetics: Negative, (VUS) in  BRIP1  ? ?Current treatment: Tamoxifen started 11/19/2017 with plan for 5 years of therapy ?Tamoxifen toxicities: Denies any adverse effects to tamoxifen ?Occasional muscle cramps ? ?Breast cancer surveillance: ?1.  Breast exam 09/09/2021: Benign ?2. Mammogram 08/27/2021: Benign ? ?Return to clinic in 1 year for follow-up ?

## 2021-10-22 ENCOUNTER — Ambulatory Visit: Payer: Medicare HMO | Admitting: Hematology and Oncology

## 2022-01-20 DIAGNOSIS — M1388 Other specified arthritis, other site: Secondary | ICD-10-CM | POA: Diagnosis not present

## 2022-01-20 DIAGNOSIS — M25552 Pain in left hip: Secondary | ICD-10-CM | POA: Diagnosis not present

## 2022-01-20 DIAGNOSIS — M25561 Pain in right knee: Secondary | ICD-10-CM | POA: Diagnosis not present

## 2022-01-20 DIAGNOSIS — M545 Low back pain, unspecified: Secondary | ICD-10-CM | POA: Diagnosis not present

## 2022-01-20 DIAGNOSIS — M25551 Pain in right hip: Secondary | ICD-10-CM | POA: Diagnosis not present

## 2022-01-20 DIAGNOSIS — M25562 Pain in left knee: Secondary | ICD-10-CM | POA: Diagnosis not present

## 2022-02-20 DIAGNOSIS — M25511 Pain in right shoulder: Secondary | ICD-10-CM | POA: Diagnosis not present

## 2022-03-28 DIAGNOSIS — Z23 Encounter for immunization: Secondary | ICD-10-CM | POA: Diagnosis not present

## 2022-06-22 DIAGNOSIS — L430 Hypertrophic lichen planus: Secondary | ICD-10-CM | POA: Diagnosis not present

## 2022-06-22 DIAGNOSIS — L570 Actinic keratosis: Secondary | ICD-10-CM | POA: Diagnosis not present

## 2022-06-22 DIAGNOSIS — D1801 Hemangioma of skin and subcutaneous tissue: Secondary | ICD-10-CM | POA: Diagnosis not present

## 2022-06-22 DIAGNOSIS — L814 Other melanin hyperpigmentation: Secondary | ICD-10-CM | POA: Diagnosis not present

## 2022-06-22 DIAGNOSIS — D2262 Melanocytic nevi of left upper limb, including shoulder: Secondary | ICD-10-CM | POA: Diagnosis not present

## 2022-06-22 DIAGNOSIS — D2261 Melanocytic nevi of right upper limb, including shoulder: Secondary | ICD-10-CM | POA: Diagnosis not present

## 2022-06-22 DIAGNOSIS — L821 Other seborrheic keratosis: Secondary | ICD-10-CM | POA: Diagnosis not present

## 2022-06-22 DIAGNOSIS — D485 Neoplasm of uncertain behavior of skin: Secondary | ICD-10-CM | POA: Diagnosis not present

## 2022-07-01 DIAGNOSIS — H90A22 Sensorineural hearing loss, unilateral, left ear, with restricted hearing on the contralateral side: Secondary | ICD-10-CM | POA: Diagnosis not present

## 2022-07-02 DIAGNOSIS — J342 Deviated nasal septum: Secondary | ICD-10-CM | POA: Diagnosis not present

## 2022-07-02 DIAGNOSIS — H903 Sensorineural hearing loss, bilateral: Secondary | ICD-10-CM | POA: Diagnosis not present

## 2022-07-02 DIAGNOSIS — J3089 Other allergic rhinitis: Secondary | ICD-10-CM | POA: Diagnosis not present

## 2022-07-02 DIAGNOSIS — J302 Other seasonal allergic rhinitis: Secondary | ICD-10-CM | POA: Diagnosis not present

## 2022-07-02 DIAGNOSIS — H608X2 Other otitis externa, left ear: Secondary | ICD-10-CM | POA: Diagnosis not present

## 2022-07-02 DIAGNOSIS — Z974 Presence of external hearing-aid: Secondary | ICD-10-CM | POA: Diagnosis not present

## 2022-07-02 DIAGNOSIS — H6121 Impacted cerumen, right ear: Secondary | ICD-10-CM | POA: Diagnosis not present

## 2022-07-02 DIAGNOSIS — J300 Vasomotor rhinitis: Secondary | ICD-10-CM | POA: Diagnosis not present

## 2022-08-18 DIAGNOSIS — H52203 Unspecified astigmatism, bilateral: Secondary | ICD-10-CM | POA: Diagnosis not present

## 2022-08-18 DIAGNOSIS — H353131 Nonexudative age-related macular degeneration, bilateral, early dry stage: Secondary | ICD-10-CM | POA: Diagnosis not present

## 2022-08-20 DIAGNOSIS — Z0100 Encounter for examination of eyes and vision without abnormal findings: Secondary | ICD-10-CM | POA: Diagnosis not present

## 2022-08-20 DIAGNOSIS — H524 Presbyopia: Secondary | ICD-10-CM | POA: Diagnosis not present

## 2022-08-26 DIAGNOSIS — N816 Rectocele: Secondary | ICD-10-CM | POA: Diagnosis not present

## 2022-08-26 DIAGNOSIS — N393 Stress incontinence (female) (male): Secondary | ICD-10-CM | POA: Diagnosis not present

## 2022-08-26 DIAGNOSIS — N8111 Cystocele, midline: Secondary | ICD-10-CM | POA: Diagnosis not present

## 2022-09-02 NOTE — Progress Notes (Signed)
Patient Care Team: Crist Infante, MD as PCP - General (Internal Medicine) Magrinat, Virgie Dad, MD (Inactive) as Consulting Physician (Oncology) Ronald Lobo, MD as Consulting Physician (Gastroenterology) Rolm Bookbinder, MD as Consulting Physician (General Surgery) Gery Pray, MD as Consulting Physician (Radiation Oncology) Latanya Maudlin, MD as Consulting Physician (Orthopedic Surgery) Melvenia Beam, MD as Consulting Physician (Neurology)  DIAGNOSIS:  Encounter Diagnosis  Name Primary?   Malignant neoplasm of upper-outer quadrant of right breast in female, estrogen receptor positive Yes    SUMMARY OF ONCOLOGIC HISTORY: Oncology History  Malignant neoplasm of upper-outer quadrant of right breast in female, estrogen receptor positive  08/02/2017 Initial Diagnosis   status post right breast upper outer quadrant biopsy for a clinical T1c N0, stage IA invasive ductal carcinoma, grade 1, estrogen and progesterone receptor positive, with an MIB-5% and no HER-2 amplification.             (a) MRI biopsy of second area in the right breast on 08/26/2017 showed ([id number]): a complex sclerosing lesion      09/13/2017 Surgery   status post right lumpectomy without sentinel lymph node sampling 09/13/2017 for a pT1b pNX, stage Ia invasive ductal carcinoma, with negative margins   09/13/2017 Oncotype testing    17 predicts a risk of outside the breast recurrence over the next 9 years of 5% if the patient's only systemic therapy is antiestrogens for 5 years.  It also predicts no benefit from chemotherapy.   10/25/2017 - 11/22/2017 Radiation Therapy   adjuvant radiation 10/25/2017 - 11/22/2017 Site/dose:    1. Right Breast / 40.05 Gy in 15 fractions 2. Right Breast Boost / 10 Gy in 5 fractions   11/29/2017 Genetic Testing   The Common Heredtiary [place] Panel + melanoma panel was ordered (51 genes). The following genes were evaluated for sequence changes and exonic  deletions/duplications:APC, ATM, AXIN2, BAP1, BARD1, BMPR1A, BRCA1, BRCA2, BRIP1, CDH1, CDK4, CDKN2A (p14ARF), CDKN2A (p16INK4a), CHEK2,CTNNA1, DICER1, EPCAM*, GREM1*, KIT, MEN1, MLH1, MSH2, MSH3, MSH6, MUTYH, NBN, NF1, PALB2, PDGFRA, PMS2, POLD1, POLE, POT1, PTEN, RAD50, RAD51C, RAD51D, RB1, SDHB, SDHC, SDHD, SMAD4, SMARCA4, STK11, TP53, TSC1, TSC2, VHL. The following genes were evaluated for sequence changes only: HOXB13*, MITF*, NTHL1*, SDHA  Results: No pathogenic variants identified.  A variant of uncertain significance in the gene BRIP1 was detected c.1655T>C (p.Ile552Thr).  The date of this test report is 11/29/2017.    11/2017 - 09/2020 Anti-estrogen oral therapy   Tamoxifen daily     CHIEF COMPLIANT: Estrogen receptor positive breast cancer on Tamoxifen   INTERVAL HISTORY: Stephanie Liu is a 82 y.o. with the above mention Estrogen receptor positive breast cancer. She presents to the clinic today for a follow-up. She reports that she is doing well. She denies any pain or discomfort in breast. She doe get time for exercise. She hikes and walks a lot.   ALLERGIES:  is allergic to codeine and sulfa antibiotics.  MEDICATIONS:  Current Outpatient Medications  Medication Sig Dispense Refill   aspirin EC 81 MG tablet Take 1 tablet (81 mg total) by mouth daily. Swallow whole. 30 tablet 12   Cholecalciferol (VITAMIN D3 PO) Take by mouth.     Evolocumab (REPATHA) 140 MG/ML SOSY Inject 140 mg into the skin every 14 (fourteen) days. 2.1 mL    metoprolol succinate (TOPROL-XL) 25 MG 24 hr tablet Take 25 mg by mouth daily.     Probiotic Product (PROBIOTIC DAILY PO) Take by mouth.     pseudoephedrine-acetaminophen (TYLENOL SINUS) 30-500  MG TABS tablet Take 1 tablet by mouth every 4 (four) hours as needed.     tamoxifen (NOLVADEX) 20 MG tablet Take 1 tablet (20 mg total) by mouth daily. 90 tablet 4   triamterene-hydrochlorothiazide (MAXZIDE-25) 37.5-25 MG tablet      No current  facility-administered medications for this visit.    PHYSICAL EXAMINATION: ECOG PERFORMANCE STATUS: 1 - Symptomatic but completely ambulatory  Vitals:   09/10/22 1411  BP: (!) 147/74  Pulse: 90  Resp: 18  Temp: (!) 97.5 F (36.4 C)  SpO2: 97%   Filed Weights   09/10/22 1411  Weight: 164 lb 11.2 oz (74.7 kg)      LABORATORY DATA:  I have reviewed the data as listed    Latest Ref Rng & Units 10/24/2020    2:49 PM 03/14/2018    2:21 PM 08/16/2017    3:45 PM  CMP  Glucose 70 - 99 mg/dL 126  104  98   BUN 8 - 23 mg/dL 22  15  18    Creatinine 0.44 - 1.00 mg/dL 0.98  0.76  0.84   Sodium 135 - 145 mmol/L 143  137  136   Potassium 3.5 - 5.1 mmol/L 4.3  3.8  4.8   Chloride 98 - 111 mmol/L 101  101  99   CO2 22 - 32 mmol/L 31  28  30    Calcium 8.9 - 10.3 mg/dL 9.2  9.0  9.8   Total Protein 6.5 - 8.1 g/dL 6.9  6.8  7.4   Total Bilirubin 0.3 - 1.2 mg/dL 0.4  0.2  0.3   Alkaline Phos 38 - 126 U/L 44  62  57   AST 15 - 41 U/L 22  25  29    ALT 0 - 44 U/L 16  20  38     Lab Results  Component Value Date   WBC 8.1 10/24/2020   HGB 14.3 10/24/2020   HCT 42.7 10/24/2020   MCV 94.1 10/24/2020   PLT 213 10/24/2020   NEUTROABS 4.5 10/24/2020    ASSESSMENT & PLAN:  Malignant neoplasm of upper-outer quadrant of right breast in female, estrogen receptor positive ([place]) 08/02/2017: Right breast biopsy: T1CN0 stage Ia grade 1 IDC ER/PR positive HER2 negative, Ki-67 10% 08/26/2017: MRI biopsy second area right breast: Complex sclerosing lesion 09/13/2017: Right lumpectomy: T1BNX stage Ia IDC negative margins, Oncotype: 17 (5% risk of recurrence) 10/25/2017- 11/22/2017: Adjuvant radiation 11/29/2017: Genetics: Negative, (VUS) in  BRIP1    Current treatment: Tamoxifen started 11/19/2017 with plan for 5 years of therapy will be needed in June 2024 Tamoxifen toxicities: Denies any adverse effects to tamoxifen Occasional muscle cramps I discussed with the patient that once she finishes 5 years of  therapy she can discontinue it.   Patient is traveling to [place] and [place] next month.  Breast cancer surveillance: 1.  Breast exam 09/10/2022: Benign 2. Mammogram 09/08/2022: Benign   Return to clinic in 1 year for follow-up    No orders of the defined types were placed in this encounter.  The patient has a good understanding of the overall plan. she agrees with it. she will call with any problems that may develop before the next visit here. Total time spent: 30 mins including face to face time and time spent for planning, charting and co-ordination of care   Harriette Ohara, MD 09/10/22    I Gardiner Coins am acting as a Education administrator for Dr.Tabytha Gradillas  I have reviewed the  above documentation for accuracy and completeness, and I agree with the above.

## 2022-09-08 DIAGNOSIS — Z1231 Encounter for screening mammogram for malignant neoplasm of breast: Secondary | ICD-10-CM | POA: Diagnosis not present

## 2022-09-10 ENCOUNTER — Other Ambulatory Visit: Payer: Self-pay

## 2022-09-10 ENCOUNTER — Inpatient Hospital Stay: Payer: Medicare HMO | Attending: Hematology and Oncology | Admitting: Hematology and Oncology

## 2022-09-10 ENCOUNTER — Telehealth: Payer: Self-pay | Admitting: Hematology and Oncology

## 2022-09-10 VITALS — BP 147/74 | HR 90 | Temp 97.5°F | Resp 18 | Ht 63.0 in | Wt 164.7 lb

## 2022-09-10 DIAGNOSIS — C50411 Malignant neoplasm of upper-outer quadrant of right female breast: Secondary | ICD-10-CM

## 2022-09-10 DIAGNOSIS — Z170 Estrogen receptor positive status [ER+]: Secondary | ICD-10-CM | POA: Diagnosis not present

## 2022-09-10 DIAGNOSIS — Z79810 Long term (current) use of selective estrogen receptor modulators (SERMs): Secondary | ICD-10-CM | POA: Insufficient documentation

## 2022-09-10 MED ORDER — ASPIRIN 81 MG PO TBEC: 81.0000 mg | DELAYED_RELEASE_TABLET | Freq: Every day | ORAL | 12 refills | Status: AC

## 2022-09-10 NOTE — Assessment & Plan Note (Signed)
08/02/2017: Right breast biopsy: T1CN0 stage Ia grade 1 IDC ER/PR positive HER2 negative, Ki-67 10% 08/26/2017: MRI biopsy second area right breast: Complex sclerosing lesion 09/13/2017: Right lumpectomy: T1BNX stage Ia IDC negative margins, Oncotype: 17 (5% risk of recurrence) 10/25/2017- 11/22/2017: Adjuvant radiation 11/29/2017: Genetics: Negative, (VUS) in  BRIP1    Current treatment: Tamoxifen started 11/19/2017 with plan for 5 years of therapy will be needed in June 2024 Tamoxifen toxicities: Denies any adverse effects to tamoxifen Occasional muscle cramps   Breast cancer surveillance: 1.  Breast exam 09/10/2022: Benign 2. Mammogram 09/08/2022: Benign   Return to clinic in 1 year for follow-up

## 2022-09-11 ENCOUNTER — Encounter: Payer: Self-pay | Admitting: Hematology and Oncology

## 2022-09-14 DIAGNOSIS — E785 Hyperlipidemia, unspecified: Secondary | ICD-10-CM | POA: Diagnosis not present

## 2022-09-14 DIAGNOSIS — R7989 Other specified abnormal findings of blood chemistry: Secondary | ICD-10-CM | POA: Diagnosis not present

## 2022-09-14 DIAGNOSIS — I10 Essential (primary) hypertension: Secondary | ICD-10-CM | POA: Diagnosis not present

## 2022-09-15 DIAGNOSIS — M25552 Pain in left hip: Secondary | ICD-10-CM | POA: Diagnosis not present

## 2022-09-18 ENCOUNTER — Telehealth: Payer: Self-pay | Admitting: Hematology and Oncology

## 2022-09-18 DIAGNOSIS — D23122 Other benign neoplasm of skin of left lower eyelid, including canthus: Secondary | ICD-10-CM | POA: Diagnosis not present

## 2022-09-21 DIAGNOSIS — Z23 Encounter for immunization: Secondary | ICD-10-CM | POA: Diagnosis not present

## 2022-09-21 DIAGNOSIS — M179 Osteoarthritis of knee, unspecified: Secondary | ICD-10-CM | POA: Diagnosis not present

## 2022-09-21 DIAGNOSIS — R82998 Other abnormal findings in urine: Secondary | ICD-10-CM | POA: Diagnosis not present

## 2022-09-21 DIAGNOSIS — R419 Unspecified symptoms and signs involving cognitive functions and awareness: Secondary | ICD-10-CM | POA: Diagnosis not present

## 2022-09-21 DIAGNOSIS — R413 Other amnesia: Secondary | ICD-10-CM | POA: Diagnosis not present

## 2022-09-21 DIAGNOSIS — I10 Essential (primary) hypertension: Secondary | ICD-10-CM | POA: Diagnosis not present

## 2022-09-21 DIAGNOSIS — I2510 Atherosclerotic heart disease of native coronary artery without angina pectoris: Secondary | ICD-10-CM | POA: Diagnosis not present

## 2022-09-21 DIAGNOSIS — K52831 Collagenous colitis: Secondary | ICD-10-CM | POA: Diagnosis not present

## 2022-09-21 DIAGNOSIS — Z0000 Encounter for general adult medical examination without abnormal findings: Secondary | ICD-10-CM | POA: Diagnosis not present

## 2022-09-21 DIAGNOSIS — E785 Hyperlipidemia, unspecified: Secondary | ICD-10-CM | POA: Diagnosis not present

## 2022-09-26 DIAGNOSIS — M25552 Pain in left hip: Secondary | ICD-10-CM | POA: Diagnosis not present

## 2022-09-30 DIAGNOSIS — M79605 Pain in left leg: Secondary | ICD-10-CM | POA: Diagnosis not present

## 2022-09-30 DIAGNOSIS — M25552 Pain in left hip: Secondary | ICD-10-CM | POA: Diagnosis not present

## 2022-10-13 DIAGNOSIS — M25552 Pain in left hip: Secondary | ICD-10-CM | POA: Diagnosis not present

## 2022-10-13 DIAGNOSIS — M79605 Pain in left leg: Secondary | ICD-10-CM | POA: Diagnosis not present

## 2022-11-01 ENCOUNTER — Other Ambulatory Visit: Payer: Self-pay | Admitting: Hematology and Oncology

## 2022-11-05 DIAGNOSIS — M6281 Muscle weakness (generalized): Secondary | ICD-10-CM | POA: Diagnosis not present

## 2022-11-05 DIAGNOSIS — N393 Stress incontinence (female) (male): Secondary | ICD-10-CM | POA: Diagnosis not present

## 2022-11-06 DIAGNOSIS — M25552 Pain in left hip: Secondary | ICD-10-CM | POA: Diagnosis not present

## 2022-11-10 DIAGNOSIS — M25552 Pain in left hip: Secondary | ICD-10-CM | POA: Diagnosis not present

## 2022-11-17 DIAGNOSIS — M25552 Pain in left hip: Secondary | ICD-10-CM | POA: Diagnosis not present

## 2022-11-24 DIAGNOSIS — M25552 Pain in left hip: Secondary | ICD-10-CM | POA: Diagnosis not present

## 2022-12-01 DIAGNOSIS — M25552 Pain in left hip: Secondary | ICD-10-CM | POA: Diagnosis not present

## 2022-12-08 DIAGNOSIS — M25552 Pain in left hip: Secondary | ICD-10-CM | POA: Diagnosis not present

## 2022-12-18 DIAGNOSIS — M25552 Pain in left hip: Secondary | ICD-10-CM | POA: Diagnosis not present

## 2023-01-06 DIAGNOSIS — R55 Syncope and collapse: Secondary | ICD-10-CM | POA: Diagnosis not present

## 2023-01-06 DIAGNOSIS — E785 Hyperlipidemia, unspecified: Secondary | ICD-10-CM | POA: Diagnosis not present

## 2023-01-06 DIAGNOSIS — I10 Essential (primary) hypertension: Secondary | ICD-10-CM | POA: Diagnosis not present

## 2023-03-13 DIAGNOSIS — Z23 Encounter for immunization: Secondary | ICD-10-CM | POA: Diagnosis not present

## 2023-04-28 DIAGNOSIS — R69 Illness, unspecified: Secondary | ICD-10-CM | POA: Diagnosis not present

## 2023-07-05 DIAGNOSIS — R0609 Other forms of dyspnea: Secondary | ICD-10-CM | POA: Diagnosis not present

## 2023-07-05 DIAGNOSIS — R053 Chronic cough: Secondary | ICD-10-CM | POA: Diagnosis not present

## 2023-07-05 DIAGNOSIS — I2510 Atherosclerotic heart disease of native coronary artery without angina pectoris: Secondary | ICD-10-CM | POA: Diagnosis not present

## 2023-07-05 DIAGNOSIS — E785 Hyperlipidemia, unspecified: Secondary | ICD-10-CM | POA: Diagnosis not present

## 2023-07-05 DIAGNOSIS — I10 Essential (primary) hypertension: Secondary | ICD-10-CM | POA: Diagnosis not present

## 2023-07-05 LAB — LAB REPORT - SCANNED: EGFR: 80.1

## 2023-07-06 ENCOUNTER — Ambulatory Visit: Payer: Medicare HMO | Attending: Cardiology | Admitting: Cardiology

## 2023-07-06 ENCOUNTER — Encounter: Payer: Self-pay | Admitting: Cardiology

## 2023-07-06 VITALS — BP 156/89 | HR 87 | Ht 63.0 in | Wt 163.2 lb

## 2023-07-06 DIAGNOSIS — I2510 Atherosclerotic heart disease of native coronary artery without angina pectoris: Secondary | ICD-10-CM | POA: Diagnosis not present

## 2023-07-06 DIAGNOSIS — I10 Essential (primary) hypertension: Secondary | ICD-10-CM

## 2023-07-06 DIAGNOSIS — R0609 Other forms of dyspnea: Secondary | ICD-10-CM

## 2023-07-06 DIAGNOSIS — E7800 Pure hypercholesterolemia, unspecified: Secondary | ICD-10-CM

## 2023-07-06 MED ORDER — VALSARTAN 160 MG PO TABS: 160.0000 mg | ORAL_TABLET | Freq: Every day | ORAL | 11 refills | Status: DC

## 2023-07-06 NOTE — Progress Notes (Signed)
Cardiology Office Note   Date:  07/06/2023   ID:  Corley, Kohls [birth year], MRN [id number]  PCP:  Rodrigo Ran, MD  Cardiologist:   Praise Stennett [place], MD   Chief Complaint  Patient presents with   Shortness of Breath      History of Present Illness: Stephanie Liu is a 83 y.o. female is seen for follow up CAD and symptoms of SOB. Last seen in 2021 when she had an abnormal coronary calcium score. She has a history of HTN and HLD. She has a chronic RBBB. She had normal Myoview studies in 2003 and 2008 and a normal stress Echo in 2011. She has a chronic RBBB. She has had difficulty tolerating statins. To evaluate her CV risk she recently had a coronary calcium score. This was elevated at 643. Heavy calcification in the LAD territory.   She notes that since this fall she develops dyspnea on exertion. Worse going up incline. Has associated wheezing and minimal cough. No real chest discomfort. BP has been higher. She was seen by PCP yesterday. Ecg showed no change in chronic RBBB. Labs and CXR done- I don't have results.   Past Medical History:  Diagnosis Date   CMV infection (HCC)    Collagenous colitis    Family history of breast cancer    Family history of melanoma    Family history of pancreatic cancer    Family history of prostate cancer    HTN (hypertension)    Hypercholesterolemia    Normal nuclear stress test 03/18/2007   Normal stress echocardiogram 02/03/2010   RBBB, CLINICALLY NEGATIVE FOR ANGINA, NORMAL LV FUNCTION WITH MILD LVH    Past Surgical History:  Procedure Laterality Date   APPENDECTOMY     BREAST LUMPECTOMY WITH RADIOACTIVE SEED LOCALIZATION Right 09/13/2017   Procedure: RIGHT BREAST LUMPECTOMY WITH RADIOACTIVE SEED LOCALIZATION, AND RIGHT BREAST SEED GUIDED  EXCISIONAL BIOPSY;  Surgeon: Emelia Loron, MD;  Location: [facility];  Service: General;  Laterality: Right;   CATARACT EXTRACTION, BILATERAL     CHOLECYSTECTOMY  1972    ROTATOR CUFF REPAIR Right    TOTAL ABDOMINAL HYSTERECTOMY W/ BILATERAL SALPINGOOPHORECTOMY       Current Outpatient Medications  Medication Sig Dispense Refill   aspirin EC 81 MG tablet Take 1 tablet (81 mg total) by mouth daily. Swallow whole. 30 tablet 12   Cholecalciferol (VITAMIN D3 PO) Take by mouth.     Evolocumab (REPATHA) 140 MG/ML SOSY Inject 140 mg into the skin every 14 (fourteen) days. 2.1 mL    Multiple Vitamins-Minerals (PRESERVISION AREDS 2 PO) Take by mouth daily. PATIENT TAKES 2 TABLETS DAILY     Probiotic Product (PROBIOTIC DAILY PO) Take by mouth.     triamterene-hydrochlorothiazide (MAXZIDE-25) 37.5-25 MG tablet      valsartan (DIOVAN) 160 MG tablet Take 1 tablet (160 mg total) by mouth daily. 30 tablet 11   No current facility-administered medications for this visit.    Allergies:   Codeine and Sulfa antibiotics    Social History:  The patient  reports that she has never smoked. She has never used smokeless tobacco. She reports current alcohol use of about 1.0 standard drink of alcohol per week. She reports that she does not use drugs.   Family History:  The patient's family history includes Alzheimer's disease in her mother; Breast cancer (age of onset: 37) in her sister; Heart attack in her brother; Pancreatic cancer in her cousin; Parkinson's disease in her  mother; Prostate cancer in her father; Rheum arthritis in her paternal aunt.    ROS:  Please see the history of present illness.   Otherwise, review of systems are positive for none.   All other systems are reviewed and negative.    PHYSICAL EXAM: VS:  BP (!) 156/89   Pulse 87   Ht 5\' 3"  (1.6 m)   Wt 163 lb 3.2 oz (74 kg)   SpO2 96%   BMI 28.91 kg/m  , BMI Body mass index is 28.91 kg/m. GEN: Well nourished, well developed, in no acute distress  HEENT: normal  Neck: no JVD, carotid bruits, or masses Cardiac: RRR; no murmur, no rubs, or gallops,no edema  Respiratory:  clear to auscultation  bilaterally, normal work of breathing GI: soft, nontender, nondistended, + BS MS: no deformity or atrophy  Skin: warm and dry, no rash Neuro:  Strength and sensation are intact Psych: euthymic mood, full affect   EKG:  EKG is not ordered today. The ekg ordered 07/05/23 demonstrates NSR with RBBB. Rate 72. This is unchanged from prior Ecg in April 2019. I have personally reviewed and interpreted this study.   Recent Labs: No results found for requested labs within last 365 days.    Lipid Panel No results found for: "CHOL", "TRIG", "HDL", "CHOLHDL", "VLDL", "LDLCALC", "LDLDIRECT"   Labs dated 07/17/19: cholesterol 181, triglycerides 94, HDL 66, LDL 96. CMET, CBC, TSH normal. Dated 09/14/22: cholesterol 132, triglycerides 85, HDL 59, LDL 56. Chemistry and TSH normal  Wt Readings from Last 3 Encounters:  07/06/23 163 lb 3.2 oz (74 kg)  09/10/22 164 lb 11.2 oz (74.7 kg)  09/09/21 160 lb 6.4 oz (72.8 kg)      Other studies Reviewed: Additional studies/ records that were reviewed today include:   ADDENDUM REPORT: 06/28/2019 12:26   CLINICAL DATA:  Risk stratification   EXAM: Coronary Calcium Score   TECHNIQUE: The patient was scanned on a [employer]. Axial non-contrast 3 mm slices were carried out through the heart. The data set was analyzed on a dedicated work station and scored using the Agatson method.   FINDINGS: Non-cardiac: See separate report from [facility].   Ascending Aorta: Normal size, no calcifications.   Pericardium: Normal.   Coronary arteries: Normal origin.   IMPRESSION: Coronary calcium score of 643. This was 35 percentile for age and sex matched control.     Electronically Signed   By: Tobias Alexander   On: 06/28/2019 12:26          ASSESSMENT AND PLAN:  1.  Dyspnea on exertion. Differential includes ischemia, diastolic dysfunction, deconditioning or asthma.  Given history of elevated coronary calcium score will  arrange for a cardiac PET CT. Also order Echo. Will discontinue Toprol and start valsartan 160 mg daily for BP. Request copy of labs and CXR done yesterday. Continue ASA.  2. Coronary calcification noted on CT  3. Hypercholesterolemia. Statin intolerant. On Repatha. LDL 56 4. HTN uncontrolled. Continue Maxzide. Add valsartan 160 mg daily. Stop Toprol.  5. History of collagenous colitis 6. History of breast CA   Current medicines are reviewed at length with the patient today.  The patient does not have concerns regarding medicines.  The following changes have been made:  no change  Labs/ tests ordered today include:   Orders Placed This Encounter  Procedures   Cardiac Stress Test: Informed Consent Details: Physician/Practitioner Attestation; Transcribe to consent form and obtain patient signature     Disposition:  FU after above studies.   Signed, Johnette Teigen [place], MD  07/06/2023 11:03 AM    [facility] [address], [place], [place], [address] Phone [phone], Fax [phone]

## 2023-07-06 NOTE — Addendum Note (Signed)
Addended by: Neoma Laming on: 07/06/2023 11:23 AM   Modules accepted: Orders

## 2023-07-06 NOTE — Patient Instructions (Addendum)
Medication Instructions:  Stop Toprol Start Valsartan 160 mg daily Continue all other medications *If you need a refill on your cardiac medications before your next appointment, please call your pharmacy*   Lab Work: None ordered   Testing/Procedures: Echo  first available   Cardiac Pet Scan will be scheduled at [facility] after approved by insurance  Follow instructions below    Follow-Up: At [facility], you and your health needs are our priority.  As part of our continuing mission to provide you with exceptional heart care, we have created designated Provider Care Teams.  These Care Teams include your primary Cardiologist (physician) and Advanced Practice Providers (APPs -  Physician Assistants and Nurse Practitioners) who all work together to provide you with the care you need, when you need it.  We recommend signing up for the patient portal called "MyChart".  Sign up information is provided on this After Visit Summary.  MyChart is used to connect with patients for Virtual Visits (Telemedicine).  Patients are able to view lab/test results, encounter notes, upcoming appointments, etc.  Non-urgent messages can be sent to your provider as well.   To learn more about what you can do with MyChart, go to [email or web address].    Your next appointment:  After Test    Provider:  Dr.Jordan           Please report to Radiology at the [facility] Main Entrance 30 minutes early for your test.  [address] [place], [place] [address]                         OR   Please report to Radiology at [facility] Main Entrance, medical mall, 30 mins prior to your test.  [address]  [place], [place]  How to Prepare for Your Cardiac PET/CT Stress Test:  Nothing to eat or drink, except water, 3 hours prior to arrival time.  NO caffeine/decaffeinated products, or chocolate 12 hours prior to arrival. (Please note decaffeinated  beverages (teas/coffees) still contain caffeine).  If you have caffeine within 12 hours prior, the test will need to be rescheduled.  Medication instructions: Take medication as prescribed  with water    Diabetic Preparation:   NO perfume, cologne or lotion on chest or abdomen area. FEMALES - Please avoid wearing dresses to this appointment.  Total time is 1 to 2 hours; you may want to bring reading material for the waiting time.    In preparation for your appointment, medication and supplies will be purchased.  Appointment availability is limited, so if you need to cancel or reschedule, please call the Radiology Department Scheduler at [phone] 24 hours in advance to avoid a cancellation fee of $100.00  What to Expect When you Arrive:  Once you arrive and check in for your appointment, you will be taken to a preparation room within the Radiology Department.  A technologist or Nurse will obtain your medical history, verify that you are correctly prepped for the exam, and explain the procedure.  Afterwards, an IV will be started in your arm and electrodes will be placed on your skin for EKG monitoring during the stress portion of the exam. Then you will be escorted to the PET/CT scanner.  There, staff will get you positioned on the scanner and obtain a blood pressure and EKG.  During the exam, you will continue to be connected to the EKG and blood pressure machines.  A small, safe amount of a radioactive tracer will be injected in your IV to obtain a series of pictures of your heart along with an injection of a stress agent.    After your Exam:  It is recommended that you eat a meal and drink a caffeinated beverage to counter act any effects of the stress agent.  Drink plenty of fluids for the remainder of the day and urinate frequently for the first couple of hours after the exam.  Your doctor will inform you of your test results within 7-10 business days.  For more information and  frequently asked questions, please visit our website: [email or web address]  For questions about your test or how to prepare for your test, please call: Cardiac Imaging Nurse Navigators Office: [phone]

## 2023-07-07 ENCOUNTER — Encounter: Payer: Self-pay | Admitting: Internal Medicine

## 2023-07-23 ENCOUNTER — Ambulatory Visit (HOSPITAL_COMMUNITY): Payer: Medicare HMO | Attending: Cardiology

## 2023-07-23 DIAGNOSIS — I10 Essential (primary) hypertension: Secondary | ICD-10-CM | POA: Diagnosis not present

## 2023-07-23 DIAGNOSIS — E7800 Pure hypercholesterolemia, unspecified: Secondary | ICD-10-CM | POA: Diagnosis not present

## 2023-07-23 DIAGNOSIS — R0609 Other forms of dyspnea: Secondary | ICD-10-CM | POA: Insufficient documentation

## 2023-07-23 DIAGNOSIS — I2510 Atherosclerotic heart disease of native coronary artery without angina pectoris: Secondary | ICD-10-CM | POA: Insufficient documentation

## 2023-07-23 LAB — ECHOCARDIOGRAM COMPLETE
Area-P 1/2: 2.78 cm2
S' Lateral: 2.5 cm

## 2023-07-23 MED ORDER — PERFLUTREN LIPID MICROSPHERE
3.0000 mL | INTRAVENOUS | Status: AC | PRN
  Administered 2023-07-23: 3 mL via INTRAVENOUS

## 2023-07-26 DIAGNOSIS — L820 Inflamed seborrheic keratosis: Secondary | ICD-10-CM | POA: Diagnosis not present

## 2023-07-26 DIAGNOSIS — L821 Other seborrheic keratosis: Secondary | ICD-10-CM | POA: Diagnosis not present

## 2023-07-26 DIAGNOSIS — D1801 Hemangioma of skin and subcutaneous tissue: Secondary | ICD-10-CM | POA: Diagnosis not present

## 2023-08-16 DIAGNOSIS — M25511 Pain in right shoulder: Secondary | ICD-10-CM | POA: Diagnosis not present

## 2023-08-30 ENCOUNTER — Encounter (HOSPITAL_COMMUNITY): Payer: Self-pay

## 2023-08-30 NOTE — Progress Notes (Unsigned)
 Cardiology Office Note   Date:  09/03/2023   ID:  Stephanie Liu, Stephanie Liu 04/16/1941, MRN [id number]  PCP:  Rodrigo Ran, MD  Cardiologist:   Farheen Pfahler [place], MD   Chief Complaint  Patient presents with   Shortness of Breath      History of Present Illness: Stephanie Liu is a 83 y.o. female is seen for follow up symptoms of SOB. Last seen in 2021 when she had an abnormal coronary calcium score. She has a history of HTN and HLD. She has a chronic RBBB. She had normal Myoview studies in 2003 and 2008 and a normal stress Echo in 2011. She has a chronic RBBB. She has had difficulty tolerating statins. To evaluate her CV risk she recently had a coronary calcium score. This was elevated at 643. Heavy calcification in the LAD territory.   When seen recently she noted dyspnea on exertion. Worse going up incline. Has associated wheezing and minimal cough. No real chest discomfort. BP has been higher. We pursued other evaluation with Echo and PET CT as noted below.   Past Medical History:  Diagnosis Date   CMV infection (HCC)    Collagenous colitis    Family history of breast cancer    Family history of melanoma    Family history of pancreatic cancer    Family history of prostate cancer    HTN (hypertension)    Hypercholesterolemia    Normal nuclear stress test 03/18/2007   Normal stress echocardiogram 02/03/2010   RBBB, CLINICALLY NEGATIVE FOR ANGINA, NORMAL LV FUNCTION WITH MILD LVH    Past Surgical History:  Procedure Laterality Date   APPENDECTOMY     BREAST LUMPECTOMY WITH RADIOACTIVE SEED LOCALIZATION Right 09/13/2017   Procedure: RIGHT BREAST LUMPECTOMY WITH RADIOACTIVE SEED LOCALIZATION, AND RIGHT BREAST SEED GUIDED  EXCISIONAL BIOPSY;  Surgeon: Emelia Loron, MD;  Location: [facility];  Service: General;  Laterality: Right;   CATARACT EXTRACTION, BILATERAL     CHOLECYSTECTOMY  1972   ROTATOR CUFF REPAIR Right    TOTAL ABDOMINAL HYSTERECTOMY W/ BILATERAL  SALPINGOOPHORECTOMY       Current Outpatient Medications  Medication Sig Dispense Refill   aspirin EC 81 MG tablet Take 1 tablet (81 mg total) by mouth daily. Swallow whole. 30 tablet 12   Cholecalciferol (VITAMIN D3 PO) Take by mouth.     Cholecalciferol 50 MCG (2000 UT) CAPS 1 capsule Orally Once a day     Evolocumab (REPATHA) 140 MG/ML SOSY Inject 140 mg into the skin every 14 (fourteen) days. 2.1 mL    Multiple Vitamins-Minerals (PRESERVISION AREDS 2 PO) Take by mouth daily. PATIENT TAKES 2 TABLETS DAILY     Probiotic Product (PROBIOTIC DAILY PO) Take by mouth.     triamterene-hydrochlorothiazide (MAXZIDE-25) 37.5-25 MG tablet      valsartan (DIOVAN) 160 MG tablet Take 1 tablet (160 mg total) by mouth daily. 30 tablet 11   No current facility-administered medications for this visit.    Allergies:   Codeine and Sulfa antibiotics    Social History:  The patient  reports that she has never smoked. She has never used smokeless tobacco. She reports current alcohol use of about 1.0 standard drink of alcohol per week. She reports that she does not use drugs.   Family History:  The patient's family history includes Alzheimer's disease in her mother; Breast cancer (age of onset: 39) in her sister; Heart attack in her brother; Pancreatic cancer in her cousin; Parkinson's disease  in her mother; Prostate cancer in her father; Rheum arthritis in her paternal aunt.    ROS:  Please see the history of present illness.   Otherwise, review of systems are positive for none.   All other systems are reviewed and negative.    PHYSICAL EXAM: VS:  BP 128/76   Pulse 81   Ht 5\' 3"  (1.6 m)   Wt 161 lb 6.4 oz (73.2 kg)   SpO2 97%   BMI 28.59 kg/m  , BMI Body mass index is 28.59 kg/m. GEN: Well nourished, well developed, in no acute distress  HEENT: normal  Neck: no JVD, carotid bruits, or masses Cardiac: RRR; no murmur, no rubs, or gallops,no edema  Respiratory:  clear to auscultation bilaterally,  normal work of breathing GI: soft, nontender, nondistended, + BS MS: no deformity or atrophy  Skin: warm and dry, no rash Neuro:  Strength and sensation are intact Psych: euthymic mood, full affect   EKG Interpretation Date/Time:  Friday September 03 2023 16:24:19 EDT Ventricular Rate:  81 PR Interval:  146 QRS Duration:  128 QT Interval:  400 QTC Calculation: 464 R Axis:   2  Text Interpretation: Normal sinus rhythm Left atrial enlargement Right bundle branch block  When compared with ECG of 08-Sep-2017 10:28,  No significant change was found  Confirmed by [place], Lemmie Steinhaus [phone]) on 09/03/2023 4:27:07 PM     Recent Labs: No results found for requested labs within last 365 days.    Lipid Panel No results found for: "CHOL", "TRIG", "HDL", "CHOLHDL", "VLDL", "LDLCALC", "LDLDIRECT"   Labs dated 07/17/19: cholesterol 181, triglycerides 94, HDL 66, LDL 96. CMET, CBC, TSH normal. Dated 09/14/22: cholesterol 132, triglycerides 85, HDL 59, LDL 56. Chemistry and TSH normal  Wt Readings from Last 3 Encounters:  09/03/23 161 lb 6.4 oz (73.2 kg)  07/06/23 163 lb 3.2 oz (74 kg)  09/10/22 164 lb 11.2 oz (74.7 kg)      Other studies Reviewed: Additional studies/ records that were reviewed today include:   ADDENDUM REPORT: 06/28/2019 12:26   CLINICAL DATA:  Risk stratification   EXAM: Coronary Calcium Score   TECHNIQUE: The patient was scanned on a [employer]. Axial non-contrast 3 mm slices were carried out through the heart. The data set was analyzed on a dedicated work station and scored using the Agatson method.   FINDINGS: Non-cardiac: See separate report from [facility].   Ascending Aorta: Normal size, no calcifications.   Pericardium: Normal.   Coronary arteries: Normal origin.   IMPRESSION: Coronary calcium score of 643. This was 44 percentile for age and sex matched control.     Electronically Signed   By: Tobias Alexander   On: 06/28/2019  12:26       Echo 07/22/22: IMPRESSIONS     1. There is a sigmoid septum with moderate thickening of the basilar  septum and narrowing of the LV outflow tract. Visually there is  significant turbulence over the LVOT but no significant gradient by  Doppler at rest. Left ventricular ejection fraction,   by estimation, is 70 to 75%. The left ventricle has hyperdynamic  function. The left ventricle has no regional wall motion abnormalities.  There is moderate asymmetric left ventricular hypertrophy of the  basal-septal segment. Left ventricular diastolic  parameters are consistent with Grade I diastolic dysfunction (impaired  relaxation).   2. Right ventricular systolic function is normal. The right ventricular  size is normal.   3. Left atrial size was mildly  dilated.   4. The mitral valve is normal in structure. Trivial mitral valve  regurgitation. No evidence of mitral stenosis.   5. The aortic valve is normal in structure. Aortic valve regurgitation is  not visualized. Aortic valve sclerosis/calcification is present, without  any evidence of aortic stenosis.   6. The inferior vena cava is normal in size with greater than 50%  respiratory variability, suggesting right atrial pressure of 3 mmHg.   Cardiac PET CT: 08/31/23: Narrative & Impression     Findings are equivocal due to discordant data. The study is intermediate risk. There is transient ischemic dilation, ratio 1.34, and a single segment of apical mild ischemia, however global myocardial blood flow reserve is normal, with normal stress flows in all coronary distributions. Consider alternate imaging modality such as coronary CTA to further evaluate.   LV perfusion is abnormal. There is evidence of ischemia. There is no evidence of infarction. Defect 1: There is a small single segment defect with mild reduction in uptake present in the apical cap location(s) that is reversible. There is abnormal wall motion in the defect area.  Consistent with ischemia.   Rest left ventricular function is normal. Rest EF: 76%. Stress left ventricular function is normal. Stress EF: 78%. End diastolic cavity size is normal. End systolic cavity size is normal. Evidence of transient ischemic dilation (TID) noted. TID was appreciated visually and quantitatively.   Myocardial blood flow was computed to be 1.39ml/g/min at rest and 2.8ml/g/min at stress. Global myocardial blood flow reserve was 2.07 and was normal. High rest flows artificially reduce the global MBFR, howeve it remains in normal range.   Coronary calcium was present on the attenuation correction CT images. Severe coronary calcifications were present. Coronary calcifications were present in the left anterior descending artery and right coronary artery distribution(s).   Electronically signed by: Parke Poisson, MD  ASSESSMENT AND PLAN:  1.  Dyspnea on exertion. Cardiac evaluation is reassuring. Echo is essentially normal for age. Gr 1 diastolic dysfunction. PET CT is low risk. ? Mild apical ischemia but normal myocardial blood flow.  In follow up today she notes dyspnea is actually better and wonders if the cold had something to do with it. We had also discontinued metoprolol and started valsartan for BP. At this point reassured. I would not pursue further evaluation unless symptoms worsen  2. Coronary calcification noted on CT  3. Hypercholesterolemia. Statin intolerant. On Repatha. LDL 56 4. HTN uncontrolled. Much better with addition of valsartan.  5. History of collagenous colitis 6. History of breast CA  She is also considered reverse shoulder reconstruction with Dr Ranell Patrick. If so she is cleared from a CV standpoint   Current medicines are reviewed at length with the patient today.  The patient does not have concerns regarding medicines.  The following changes have been made:  no change  Labs/ tests ordered today include:   Orders Placed This Encounter  Procedures    EKG 12-Lead     Disposition:   FU PRN for worsening symptoms.   Signed, Ayodele Hartsock [place], MD  09/03/2023 5:01 PM    [facility] [address], [place], [place], [address] Phone [phone], Fax [phone]

## 2023-08-31 ENCOUNTER — Telehealth (HOSPITAL_COMMUNITY): Payer: Self-pay | Admitting: Emergency Medicine

## 2023-08-31 ENCOUNTER — Other Ambulatory Visit: Payer: Self-pay | Admitting: Orthopedic Surgery

## 2023-08-31 DIAGNOSIS — M25511 Pain in right shoulder: Secondary | ICD-10-CM

## 2023-08-31 DIAGNOSIS — M19111 Post-traumatic osteoarthritis, right shoulder: Secondary | ICD-10-CM | POA: Diagnosis not present

## 2023-08-31 NOTE — Telephone Encounter (Signed)
Attempted to call patient regarding upcoming cardiac PET appointment. Left message on voicemail with name and callback number Aidan Moten RN Navigator Cardiac Imaging [facility] [phone] Office [phone] Cell  

## 2023-08-31 NOTE — Telephone Encounter (Signed)
 Reaching out to patient to offer assistance regarding upcoming cardiac imaging study; pt verbalizes understanding of appt date/time, parking situation and where to check in, pre-test NPO status and medications ordered, and verified current allergies; name and call back number provided for further questions should they arise Rockwell Alexandria RN Navigator Cardiac Imaging [facility] [phone] office [phone] cell  R arm restriction

## 2023-09-01 ENCOUNTER — Encounter (HOSPITAL_COMMUNITY)
Admission: RE | Admit: 2023-09-01 | Discharge: 2023-09-01 | Disposition: A | Discharge status: Home / Self Care | Payer: Medicare HMO | Source: Ambulatory Visit | Attending: Cardiology | Admitting: Cardiology

## 2023-09-01 ENCOUNTER — Ambulatory Visit
Admission: RE | Admit: 2023-09-01 | Discharge: 2023-09-01 | Disposition: A | Discharge status: Home / Self Care | Source: Ambulatory Visit | Attending: Orthopedic Surgery | Admitting: Orthopedic Surgery

## 2023-09-01 DIAGNOSIS — I10 Essential (primary) hypertension: Secondary | ICD-10-CM | POA: Diagnosis not present

## 2023-09-01 DIAGNOSIS — R0609 Other forms of dyspnea: Secondary | ICD-10-CM | POA: Insufficient documentation

## 2023-09-01 DIAGNOSIS — M19011 Primary osteoarthritis, right shoulder: Secondary | ICD-10-CM | POA: Diagnosis not present

## 2023-09-01 DIAGNOSIS — M25511 Pain in right shoulder: Secondary | ICD-10-CM

## 2023-09-01 DIAGNOSIS — E7800 Pure hypercholesterolemia, unspecified: Secondary | ICD-10-CM | POA: Diagnosis not present

## 2023-09-01 DIAGNOSIS — I2510 Atherosclerotic heart disease of native coronary artery without angina pectoris: Secondary | ICD-10-CM | POA: Insufficient documentation

## 2023-09-01 DIAGNOSIS — G8929 Other chronic pain: Secondary | ICD-10-CM | POA: Diagnosis not present

## 2023-09-01 DIAGNOSIS — M25411 Effusion, right shoulder: Secondary | ICD-10-CM | POA: Diagnosis not present

## 2023-09-01 LAB — NM PET CT CARDIAC PERFUSION MULTI W/ABSOLUTE BLOODFLOW
MBFR: 2.07
Nuc Rest EF: 76 %
Nuc Stress EF: 78 %
Peak HR: 101 {beats}/min
Rest HR: 76 {beats}/min
Rest MBF: 1.34 ml/g/min
Rest Nuclear Isotope Dose: 19.1 mCi
ST Depression (mm): 0 mm
Stress MBF: 2.77 ml/g/min
Stress Nuclear Isotope Dose: 19.2 mCi
TID: 1.34

## 2023-09-01 MED ORDER — RUBIDIUM RB82 GENERATOR (RUBYFILL)
19.1500 | PACK | Freq: Once | INTRAVENOUS | Status: AC
  Administered 2023-09-01: 19.15 via INTRAVENOUS

## 2023-09-01 MED ORDER — REGADENOSON 0.4 MG/5ML IV SOLN
0.4000 mg | Freq: Once | INTRAVENOUS | Status: AC
  Administered 2023-09-01: 0.4 mg via INTRAVENOUS

## 2023-09-01 MED ORDER — REGADENOSON 0.4 MG/5ML IV SOLN
INTRAVENOUS | Status: AC
  Filled 2023-09-01: qty 5

## 2023-09-01 MED ORDER — RUBIDIUM RB82 GENERATOR (RUBYFILL)
19.1400 | PACK | Freq: Once | INTRAVENOUS | Status: AC
  Administered 2023-09-01: 19.14 via INTRAVENOUS

## 2023-09-02 ENCOUNTER — Telehealth: Payer: Self-pay

## 2023-09-02 NOTE — Telephone Encounter (Signed)
   Name: Stephanie Liu  DOB: 13-Jun-1940  MRN: [id number]  Primary Cardiologist: None  Chart reviewed as part of pre-operative protocol coverage. Because of ITHA KROEKER past medical history and time since last visit, she will require a follow-up in-office visit in order to better assess preoperative cardiovascular risk.  Pre-op covering staff: - Please schedule appointment and call patient to inform them. If patient already had an upcoming appointment within acceptable timeframe, please add "pre-op clearance" to the appointment notes so provider is aware. - Please contact requesting surgeon's office via preferred method (i.e, phone, fax) to inform them of need for appointment prior to surgery.  As long as asymptomatic at appointment can hold ASA for 5-7 days, please resume when medically safe to do so.  Sharlene Dory, PA-C  09/02/2023, 4:44 PM

## 2023-09-02 NOTE — Telephone Encounter (Signed)
   Pre-operative Risk Assessment    Patient Name: Stephanie Liu  DOB: 1940/07/21 MRN: [id number]   Date of last office visit: 07/06/23 WITH DR. [place] Date of next office visit: 09/03/23 WITH DR. [place]   Request for Surgical Clearance    Procedure:   RIGHT REVERSE TSA  Date of Surgery:  Clearance TBD                                Surgeon:  DR. Malon Kindle Surgeon's Group or Practice Name:  [facility] Phone number:  [phone] Fax number:  [phone]   Type of Clearance Requested:   - Medical  - Pharmacy:  Hold Aspirin NEEDS INSTRUCTIONS IF OR WHEN TO HOLD   Type of Anesthesia:   CHOICE   Additional requests/questions:    SignedMichaelle Copas   09/02/2023, 4:11 PM

## 2023-09-02 NOTE — Telephone Encounter (Signed)
 I have updated the appointment notes to reflect pre-op clearance.

## 2023-09-03 ENCOUNTER — Encounter: Payer: Self-pay | Admitting: Cardiology

## 2023-09-03 ENCOUNTER — Ambulatory Visit: Payer: Medicare HMO | Attending: Cardiology | Admitting: Cardiology

## 2023-09-03 VITALS — BP 128/76 | HR 81 | Ht 63.0 in | Wt 161.4 lb

## 2023-09-03 DIAGNOSIS — I2510 Atherosclerotic heart disease of native coronary artery without angina pectoris: Secondary | ICD-10-CM

## 2023-09-03 DIAGNOSIS — E7800 Pure hypercholesterolemia, unspecified: Secondary | ICD-10-CM | POA: Diagnosis not present

## 2023-09-03 DIAGNOSIS — I10 Essential (primary) hypertension: Secondary | ICD-10-CM

## 2023-09-03 DIAGNOSIS — R0609 Other forms of dyspnea: Secondary | ICD-10-CM

## 2023-09-03 NOTE — Patient Instructions (Signed)
 Medication Instructions:  Continue same medications  Lab Work: None ordered  Testing/Procedures: None ordered  Follow-Up: At [facility], you and your health needs are our priority.  As part of our continuing mission to provide you with exceptional heart care, our providers are all part of one team.  This team includes your primary Cardiologist (physician) and Advanced Practice Providers or APPs (Physician Assistants and Nurse Practitioners) who all work together to provide you with the care you need, when you need it.  Your next appointment:  As Needed    Provider:  Dr.Jordan   We recommend signing up for the patient portal called "MyChart".  Sign up information is provided on this After Visit Summary.  MyChart is used to connect with patients for Virtual Visits (Telemedicine).  Patients are able to view lab/test results, encounter notes, upcoming appointments, etc.  Non-urgent messages can be sent to your provider as well.   To learn more about what you can do with MyChart, go to [email or web address].        1st Floor: - Lobby - Registration  - Pharmacy  - Lab - Cafe  2nd Floor: - PV Lab - Diagnostic Testing (echo, CT, nuclear med)  3rd Floor: - Vacant  4th Floor: - TCTS (cardiothoracic surgery) - AFib Clinic - Structural Heart Clinic - Vascular Surgery  - Vascular Ultrasound  5th Floor: - HeartCare Cardiology (general and EP) - Clinical Pharmacy for coumadin, hypertension, lipid, weight-loss medications, and med management appointments    Valet parking services will be available as well.

## 2023-09-07 DIAGNOSIS — Z961 Presence of intraocular lens: Secondary | ICD-10-CM | POA: Diagnosis not present

## 2023-09-07 DIAGNOSIS — L570 Actinic keratosis: Secondary | ICD-10-CM | POA: Diagnosis not present

## 2023-09-07 DIAGNOSIS — H353131 Nonexudative age-related macular degeneration, bilateral, early dry stage: Secondary | ICD-10-CM | POA: Diagnosis not present

## 2023-09-07 DIAGNOSIS — D2262 Melanocytic nevi of left upper limb, including shoulder: Secondary | ICD-10-CM | POA: Diagnosis not present

## 2023-09-07 DIAGNOSIS — L814 Other melanin hyperpigmentation: Secondary | ICD-10-CM | POA: Diagnosis not present

## 2023-09-07 DIAGNOSIS — H52203 Unspecified astigmatism, bilateral: Secondary | ICD-10-CM | POA: Diagnosis not present

## 2023-09-07 DIAGNOSIS — D2261 Melanocytic nevi of right upper limb, including shoulder: Secondary | ICD-10-CM | POA: Diagnosis not present

## 2023-09-07 DIAGNOSIS — L821 Other seborrheic keratosis: Secondary | ICD-10-CM | POA: Diagnosis not present

## 2023-09-08 ENCOUNTER — Ambulatory Visit: Payer: Medicare HMO | Admitting: Hematology and Oncology

## 2023-09-09 DIAGNOSIS — Z1231 Encounter for screening mammogram for malignant neoplasm of breast: Secondary | ICD-10-CM | POA: Diagnosis not present

## 2023-09-17 ENCOUNTER — Telehealth: Payer: Self-pay | Admitting: Hematology and Oncology

## 2023-09-17 NOTE — Telephone Encounter (Signed)
 Left vm for pt about rescheduled appt date and time.

## 2023-09-21 DIAGNOSIS — M25561 Pain in right knee: Secondary | ICD-10-CM | POA: Diagnosis not present

## 2023-09-21 DIAGNOSIS — M25611 Stiffness of right shoulder, not elsewhere classified: Secondary | ICD-10-CM | POA: Diagnosis not present

## 2023-09-23 ENCOUNTER — Ambulatory Visit: Payer: Medicare HMO | Admitting: Hematology and Oncology

## 2023-09-28 ENCOUNTER — Telehealth: Payer: Self-pay | Admitting: Cardiology

## 2023-09-28 NOTE — Telephone Encounter (Signed)
   Patient Name: Stephanie Liu  DOB: 06/12/1940 MRN: [id number]  Primary Cardiologist: None  Chart reviewed as part of pre-operative protocol coverage.  Patient was last seen in the office on 09/03/2023 by Dr. [place] and was cleared for surgery.  Therefore, given past medical history and time since last visit, based on ACC/AHA guidelines, Stephanie Liu is at acceptable risk for the planned procedure without further cardiovascular testing.   Regarding ASA therapy, we recommend continuation of ASA throughout the perioperative period.  However, if the surgeon feels that cessation of ASA is required in the perioperative period, it may be stopped 5-7 days prior to surgery with a plan to resume it as soon as felt to be feasible from a surgical standpoint in the post-operative period.  I will route this recommendation to the requesting party via Epic fax function and remove from pre-op pool.  Please call with questions.  Jude Norton, NP 09/28/2023, 12:00 PM

## 2023-09-28 NOTE — Telephone Encounter (Signed)
   Pre-operative Risk Assessment    Patient Name: Stephanie Liu  DOB: April 05, 1941 MRN: [id number]   Date of last office visit: 09/03/2023 Date of next office visit: none   Request for Surgical Clearance    Procedure:   right reverse TSA  Date of Surgery:  Clearance TBD                                Surgeon:  Dr. Marionette Sick Surgeon's Group or Practice Name:  [facility] Phone number:  [phone]  Enrig[facility] Harvard Fax number:  [phone]   Type of Clearance Requested:   - Medical  - Pharmacy:  Hold Aspirin  need instruction   Type of Anesthesia:   choice   Additional requests/questions:    SignedLacey Pian   09/28/2023, 11:31 AM

## 2023-09-29 DIAGNOSIS — M25561 Pain in right knee: Secondary | ICD-10-CM | POA: Diagnosis not present

## 2023-09-29 DIAGNOSIS — M25611 Stiffness of right shoulder, not elsewhere classified: Secondary | ICD-10-CM | POA: Diagnosis not present

## 2023-10-05 ENCOUNTER — Inpatient Hospital Stay: Admitting: Hematology and Oncology

## 2023-10-05 DIAGNOSIS — M25611 Stiffness of right shoulder, not elsewhere classified: Secondary | ICD-10-CM | POA: Diagnosis not present

## 2023-10-05 DIAGNOSIS — M25561 Pain in right knee: Secondary | ICD-10-CM | POA: Diagnosis not present

## 2023-10-05 NOTE — Assessment & Plan Note (Deleted)
 08/02/2017: Right breast biopsy: T1CN0 stage Ia grade 1 IDC ER/PR positive HER2 negative, Ki-67 10% 08/26/2017: MRI biopsy second area right breast: Complex sclerosing lesion 09/13/2017: Right lumpectomy: T1BNX stage Ia IDC negative margins, Oncotype: 17 (5% risk of recurrence) 10/25/2017- 11/22/2017: Adjuvant radiation 11/29/2017: Genetics: Negative, (VUS) in  BRIP1    Prior treatment: Tamoxifen  11/19/2017 -June 2024   Patient is traveling to [place] and [place] next month.   Breast cancer surveillance: 1.  Breast exam 10/05/2023: Benign 2. Mammogram 09/09/2023: Benign, density category B   Return to clinic in 1 year for follow-up

## 2023-10-06 ENCOUNTER — Telehealth: Payer: Self-pay | Admitting: Hematology and Oncology

## 2023-10-06 NOTE — Telephone Encounter (Signed)
 Confirmed with pt rescheduled appt date and time

## 2023-10-07 ENCOUNTER — Encounter: Payer: Self-pay | Admitting: Radiology

## 2023-10-14 DIAGNOSIS — M25561 Pain in right knee: Secondary | ICD-10-CM | POA: Diagnosis not present

## 2023-10-14 DIAGNOSIS — M25611 Stiffness of right shoulder, not elsewhere classified: Secondary | ICD-10-CM | POA: Diagnosis not present

## 2023-10-20 DIAGNOSIS — M25511 Pain in right shoulder: Secondary | ICD-10-CM | POA: Diagnosis not present

## 2023-10-20 DIAGNOSIS — M25611 Stiffness of right shoulder, not elsewhere classified: Secondary | ICD-10-CM | POA: Diagnosis not present

## 2023-10-29 DIAGNOSIS — M25511 Pain in right shoulder: Secondary | ICD-10-CM | POA: Diagnosis not present

## 2023-10-29 DIAGNOSIS — M25611 Stiffness of right shoulder, not elsewhere classified: Secondary | ICD-10-CM | POA: Diagnosis not present

## 2023-11-05 ENCOUNTER — Inpatient Hospital Stay: Attending: Adult Health | Admitting: Adult Health

## 2023-11-05 ENCOUNTER — Encounter: Payer: Self-pay | Admitting: Adult Health

## 2023-11-05 VITALS — BP 136/65 | HR 78 | Temp 97.5°F | Resp 18 | Ht 63.0 in | Wt 162.7 lb

## 2023-11-05 DIAGNOSIS — C50411 Malignant neoplasm of upper-outer quadrant of right female breast: Secondary | ICD-10-CM | POA: Diagnosis not present

## 2023-11-05 DIAGNOSIS — Z853 Personal history of malignant neoplasm of breast: Secondary | ICD-10-CM | POA: Diagnosis not present

## 2023-11-05 DIAGNOSIS — M1990 Unspecified osteoarthritis, unspecified site: Secondary | ICD-10-CM | POA: Insufficient documentation

## 2023-11-05 DIAGNOSIS — Z170 Estrogen receptor positive status [ER+]: Secondary | ICD-10-CM

## 2023-11-05 DIAGNOSIS — Z800 Family history of malignant neoplasm of digestive organs: Secondary | ICD-10-CM | POA: Insufficient documentation

## 2023-11-05 DIAGNOSIS — Z8042 Family history of malignant neoplasm of prostate: Secondary | ICD-10-CM | POA: Insufficient documentation

## 2023-11-05 DIAGNOSIS — Z808 Family history of malignant neoplasm of other organs or systems: Secondary | ICD-10-CM | POA: Diagnosis not present

## 2023-11-05 DIAGNOSIS — Z803 Family history of malignant neoplasm of breast: Secondary | ICD-10-CM | POA: Diagnosis not present

## 2023-11-05 DIAGNOSIS — H3530 Unspecified macular degeneration: Secondary | ICD-10-CM | POA: Insufficient documentation

## 2023-11-05 DIAGNOSIS — Z923 Personal history of irradiation: Secondary | ICD-10-CM | POA: Insufficient documentation

## 2023-11-05 NOTE — Assessment & Plan Note (Signed)
 08/02/2017: Right breast biopsy: T1CN0 stage Ia grade 1 IDC ER/PR positive HER2 negative, Ki-67 10% 08/26/2017: MRI biopsy second area right breast: Complex sclerosing lesion 09/13/2017: Right lumpectomy: T1BNX stage Ia IDC negative margins, Oncotype: 17 (5% risk of recurrence) 10/25/2017- 11/22/2017: Adjuvant radiation 11/29/2017: Genetics: Negative, (VUS) in  BRIP1  Completed 5 years of tamoxifen  in 2024.    Assessment and Plan Assessment & Plan Breast cancer Completed adjuvant therapy. No malignancy on recent mammogram. Prefers annual exams for recurrence detection. - Continue annual mammograms. - Schedule annual breast exam. - Report any new breast changes immediately. - Follow up with genetic counselors regarding variant of uncertain significance--they noted BRIP VUS is still classified as VUS.    Arthritis Chronic arthritis. Declines shoulder replacement due to anesthesia concerns. - Continue walking and exercise classes.  Age-related macular degeneration Early stage. - Continue AREDS supplements.  RTC in 1 year for continued surveillance.

## 2023-11-05 NOTE — Progress Notes (Signed)
 [facility] Cancer Follow up:    Stephanie Hun, MD [address] [place] [address]   DIAGNOSIS:  Cancer Staging  Malignant neoplasm of upper-outer quadrant of right breast in female, estrogen receptor positive (HCC) Staging form: Breast, AJCC 8th Edition - Clinical: Stage IA (cT1c, cN0, cM0, G1, ER+, PR+, HER2-) - Unsigned Histologic grading system: 3 grade system - Pathologic: Stage IA (pT1b, pN0, cM0, G1, ER+, PR+, HER2-, Oncotype DX score: 17) - Unsigned Multigene prognostic tests performed: Oncotype DX Recurrence score range: Greater than or equal to 11 Histologic grading system: 3 grade system    SUMMARY OF ONCOLOGIC HISTORY: Oncology History  Malignant neoplasm of upper-outer quadrant of right breast in female, estrogen receptor positive (HCC)  08/02/2017 Initial Diagnosis   status post right breast upper outer quadrant biopsy for a clinical T1c N0, stage IA invasive ductal carcinoma, grade 1, estrogen and progesterone receptor positive, with an MIB-5% and no HER-2 amplification.             (a) MRI biopsy of second area in the right breast on 08/26/2017 showed ([id number]): a complex sclerosing lesion      09/13/2017 Surgery   status post right lumpectomy without sentinel lymph node sampling 09/13/2017 for a pT1b pNX, stage Ia invasive ductal carcinoma, with negative margins   09/13/2017 Oncotype testing    17 predicts a risk of outside the breast recurrence over the next 9 years of 5% if the patient's only systemic therapy is antiestrogens for 5 years.  It also predicts no benefit from chemotherapy.   10/25/2017 - 11/22/2017 Radiation Therapy   adjuvant radiation 10/25/2017 - 11/22/2017 Site/dose:    1. Right Breast / 40.05 Gy in 15 fractions 2. Right Breast Boost / 10 Gy in 5 fractions   11/29/2017 Genetic Testing   The Common Heredtiary Caners Panel + melanoma panel was ordered (51 genes). The following genes were evaluated for sequence changes and exonic  deletions/duplications:APC, ATM, AXIN2, BAP1, BARD1, BMPR1A, BRCA1, BRCA2, BRIP1, CDH1, CDK4, CDKN2A (p14ARF), CDKN2A (p16INK4a), CHEK2,CTNNA1, DICER1, EPCAM*, GREM1*, KIT, MEN1, MLH1, MSH2, MSH3, MSH6, MUTYH, NBN, NF1, PALB2, PDGFRA, PMS2, POLD1, POLE, POT1, PTEN, RAD50, RAD51C, RAD51D, RB1, SDHB, SDHC, SDHD, SMAD4, SMARCA4, STK11, TP53, TSC1, TSC2, VHL. The following genes were evaluated for sequence changes only: HOXB13*, MITF*, NTHL1*, SDHA  Results: No pathogenic variants identified.  A variant of uncertain significance in the gene BRIP1 was detected c.1655T>C (p.Ile552Thr).  The date of this test report is 11/29/2017.    11/2017 - 11/2022 Anti-estrogen oral therapy   Tamoxifen  daily     CURRENT THERAPY: observation  INTERVAL HISTORY:  Discussed the use of AI scribe software for clinical note transcription with the patient, who gave verbal consent to proceed.  History of Present Illness Stephanie Liu is an 83 year old female with breast cancer who presents for follow-up after completing adjuvant therapy.  Diagnosed with breast cancer in 2019, she completed adjuvant radiation and estrogen therapy with tamoxifen  in June 2024. Her most recent bilateral breast screening mammogram in April 2025 showed no mammographic evidence of malignancy, with breast density category B. No breast changes have been noted since her last visit.  She is currently taking vitamin D and medication for early macular degeneration. Genetic testing in 2019 revealed a variant of uncertain significance, with no updates on this status. She is concerned about the implications for her granddaughters.     Patient Active Problem List   Diagnosis Date Noted   Genetic testing 12/15/2017  Family history of breast cancer    Family history of pancreatic cancer    Family history of prostate cancer    Family history of melanoma    Malignant neoplasm of upper-outer quadrant of right breast in female, estrogen receptor  positive (HCC) 08/16/2017   HTN (hypertension) 08/24/2011   Hyperlipidemia 08/24/2011    is allergic to codeine and sulfa antibiotics.  MEDICAL HISTORY: Past Medical History:  Diagnosis Date   CMV infection (HCC)    Collagenous colitis    Family history of breast cancer    Family history of melanoma    Family history of pancreatic cancer    Family history of prostate cancer    HTN (hypertension)    Hypercholesterolemia    Normal nuclear stress test 03/18/2007   Normal stress echocardiogram 02/03/2010   RBBB, CLINICALLY NEGATIVE FOR ANGINA, NORMAL LV FUNCTION WITH MILD LVH    SURGICAL HISTORY: Past Surgical History:  Procedure Laterality Date   APPENDECTOMY     BREAST LUMPECTOMY WITH RADIOACTIVE SEED LOCALIZATION Right 09/13/2017   Procedure: RIGHT BREAST LUMPECTOMY WITH RADIOACTIVE SEED LOCALIZATION, AND RIGHT BREAST SEED GUIDED  EXCISIONAL BIOPSY;  Surgeon: Enid Harry, MD;  Location: [facility];  Service: General;  Laterality: Right;   CATARACT EXTRACTION, BILATERAL     CHOLECYSTECTOMY  1972   ROTATOR CUFF REPAIR Right    TOTAL ABDOMINAL HYSTERECTOMY W/ BILATERAL SALPINGOOPHORECTOMY      SOCIAL HISTORY: Social History   Socioeconomic History   Marital status: Married    Spouse name: Not on file   Number of children: Not on file   Years of education: Not on file   Highest education level: Not on file  Occupational History   Not on file  Tobacco Use   Smoking status: Never   Smokeless tobacco: Never  Vaping Use   Vaping status: Never Used  Substance and Sexual Activity   Alcohol  use: Yes    Alcohol /week: 1.0 standard drink of alcohol     Types: 1 Glasses of wine per week    Comment: Occasional   Drug use: Never   Sexual activity: Not on file  Other Topics Concern   Not on file  Social History Narrative   Not on file   Social Drivers of Health   Financial Resource Strain: Not on file  Food Insecurity: Not on file  Transportation  Needs: Not on file  Physical Activity: Not on file  Stress: Not on file  Social Connections: Not on file  Intimate Partner Violence: Not on file    FAMILY HISTORY: Family History  Problem Relation Age of Onset   Parkinson's disease Mother    Alzheimer's disease Mother    Prostate cancer Father        'spread outside of prostate'   Breast cancer Sister 58   Heart attack Brother    Rheum arthritis Paternal Aunt    Pancreatic cancer Cousin        40's/50's   Anemia Neg Hx    Arrhythmia Neg Hx    Asthma Neg Hx    Clotting disorder Neg Hx    Fainting Neg Hx    Heart disease Neg Hx    Heart failure Neg Hx    Hyperlipidemia Neg Hx    Hypertension Neg Hx     Review of Systems  Constitutional:  Negative for appetite change, chills, fatigue, fever and unexpected weight change.  HENT:   Negative for hearing loss, lump/mass and trouble swallowing.  Eyes:  Negative for eye problems and icterus.  Respiratory:  Negative for chest tightness, cough and shortness of breath.   Cardiovascular:  Negative for chest pain, leg swelling and palpitations.  Gastrointestinal:  Negative for abdominal distention, abdominal pain, constipation, diarrhea, nausea and vomiting.  Endocrine: Negative for hot flashes.  Genitourinary:  Negative for difficulty urinating.   Musculoskeletal:  Negative for arthralgias.  Skin:  Negative for itching and rash.  Neurological:  Negative for dizziness, extremity weakness, headaches and numbness.  Hematological:  Negative for adenopathy. Does not bruise/bleed easily.  Psychiatric/Behavioral:  Negative for depression. The patient is not nervous/anxious.       PHYSICAL EXAMINATION    Vitals:   11/05/23 1153  BP: 136/65  Pulse: 78  Resp: 18  Temp: (!) 97.5 F (36.4 C)  SpO2: 97%    Physical Exam Constitutional:      General: She is not in acute distress.    Appearance: Normal appearance. She is not toxic-appearing.  HENT:     Head: Normocephalic and  atraumatic.     Mouth/Throat:     Mouth: Mucous membranes are moist.     Pharynx: Oropharynx is clear. No oropharyngeal exudate or posterior oropharyngeal erythema.  Eyes:     General: No scleral icterus. Cardiovascular:     Rate and Rhythm: Normal rate and regular rhythm.     Pulses: Normal pulses.     Heart sounds: Normal heart sounds.  Pulmonary:     Effort: Pulmonary effort is normal.     Breath sounds: Normal breath sounds.  Chest:     Comments: Right breast s/p lumpectomy and radiation, no sign of local recurrence, left breast benign Abdominal:     General: Abdomen is flat. Bowel sounds are normal. There is no distension.     Palpations: Abdomen is soft.     Tenderness: There is no abdominal tenderness.  Musculoskeletal:        General: No swelling.     Cervical back: Neck supple.  Lymphadenopathy:     Cervical: No cervical adenopathy.     Upper Body:     Right upper body: No supraclavicular or axillary adenopathy.     Left upper body: No supraclavicular or axillary adenopathy.  Skin:    General: Skin is warm and dry.     Findings: No rash.  Neurological:     General: No focal deficit present.     Mental Status: She is alert.  Psychiatric:        Mood and Affect: Mood normal.        Behavior: Behavior normal.     ASSESSMENT and THERAPY PLAN:   Malignant neoplasm of upper-outer quadrant of right breast in female, estrogen receptor positive (HCC) 08/02/2017: Right breast biopsy: T1CN0 stage Ia grade 1 IDC ER/PR positive HER2 negative, Ki-67 10% 08/26/2017: MRI biopsy second area right breast: Complex sclerosing lesion 09/13/2017: Right lumpectomy: T1BNX stage Ia IDC negative margins, Oncotype: 17 (5% risk of recurrence) 10/25/2017- 11/22/2017: Adjuvant radiation 11/29/2017: Genetics: Negative, (VUS) in  BRIP1  Completed 5 years of tamoxifen  in 2024.    Assessment and Plan Assessment & Plan Breast cancer Completed adjuvant therapy. No malignancy on recent mammogram.  Prefers annual exams for recurrence detection. - Continue annual mammograms. - Schedule annual breast exam. - Report any new breast changes immediately. - Follow up with genetic counselors regarding variant of uncertain significance--they noted BRIP VUS is still classified as VUS.    Arthritis Chronic arthritis. Declines shoulder replacement  due to anesthesia concerns. - Continue walking and exercise classes.  Age-related macular degeneration Early stage. - Continue AREDS supplements.  RTC in 1 year for continued surveillance.   All questions were answered. The patient knows to call the clinic with any problems, questions or concerns. We can certainly see the patient much sooner if necessary.  Total encounter time:20 minutes*in face-to-face visit time, chart review, lab review, care coordination, order entry, and documentation of the encounter time.    Stephanie Baars, NP 11/05/23 3:37 PM Medical Oncology and Hematology [facility] [address] [place], [place] [address] Tel. [phone]    Fax. [phone]  *Total Encounter Time as defined by the Centers for Medicare and Medicaid Services includes, in addition to the face-to-face time of a patient visit (documented in the note above) non-face-to-face time: obtaining and reviewing outside history, ordering and reviewing medications, tests or procedures, care coordination (communications with other health care professionals or caregivers) and documentation in the medical record.

## 2023-12-02 DIAGNOSIS — I10 Essential (primary) hypertension: Secondary | ICD-10-CM | POA: Diagnosis not present

## 2023-12-02 DIAGNOSIS — R5383 Other fatigue: Secondary | ICD-10-CM | POA: Diagnosis not present

## 2023-12-02 DIAGNOSIS — E785 Hyperlipidemia, unspecified: Secondary | ICD-10-CM | POA: Diagnosis not present

## 2023-12-02 DIAGNOSIS — E7849 Other hyperlipidemia: Secondary | ICD-10-CM | POA: Diagnosis not present

## 2023-12-02 DIAGNOSIS — R7989 Other specified abnormal findings of blood chemistry: Secondary | ICD-10-CM | POA: Diagnosis not present

## 2023-12-09 DIAGNOSIS — Z780 Asymptomatic menopausal state: Secondary | ICD-10-CM | POA: Diagnosis not present

## 2023-12-09 DIAGNOSIS — R82998 Other abnormal findings in urine: Secondary | ICD-10-CM | POA: Diagnosis not present

## 2023-12-09 DIAGNOSIS — I10 Essential (primary) hypertension: Secondary | ICD-10-CM | POA: Diagnosis not present

## 2023-12-22 ENCOUNTER — Ambulatory Visit (INDEPENDENT_AMBULATORY_CARE_PROVIDER_SITE_OTHER): Admitting: Otolaryngology

## 2023-12-22 ENCOUNTER — Encounter (INDEPENDENT_AMBULATORY_CARE_PROVIDER_SITE_OTHER): Payer: Self-pay | Admitting: Otolaryngology

## 2023-12-22 VITALS — BP 128/76 | HR 95

## 2023-12-22 DIAGNOSIS — H6122 Impacted cerumen, left ear: Secondary | ICD-10-CM

## 2023-12-22 DIAGNOSIS — H6123 Impacted cerumen, bilateral: Secondary | ICD-10-CM

## 2023-12-22 DIAGNOSIS — H903 Sensorineural hearing loss, bilateral: Secondary | ICD-10-CM

## 2023-12-22 NOTE — Progress Notes (Unsigned)
 CC: Hearing loss  HPI:  Stephanie Liu is a 83 y.o. female who presents today for evaluation of her hearing loss.  According to the patient, she was born deaf on the right side.  She has been hearing exclusively on the left side.  Approximately 2 years ago, she had an episode of sudden left ear hearing loss.  She was treated with prednisone by Dr. Floy, with improvement in her left ear hearing.  She was also fitted with a left ear hearing aid.  Her audiologist has since left her previous practice.  She would like to have her hearing aids serviced.  Currently she denies any otalgia or otorrhea.  She has no previous otologic surgery.  Past Medical History:  Diagnosis Date   CMV infection (HCC)    Collagenous colitis    Family history of breast cancer    Family history of melanoma    Family history of pancreatic cancer    Family history of prostate cancer    HTN (hypertension)    Hypercholesterolemia    Normal nuclear stress test 03/18/2007   Normal stress echocardiogram 02/03/2010   RBBB, CLINICALLY NEGATIVE FOR ANGINA, NORMAL LV FUNCTION WITH MILD LVH    Past Surgical History:  Procedure Laterality Date   APPENDECTOMY     BREAST LUMPECTOMY WITH RADIOACTIVE SEED LOCALIZATION Right 09/13/2017   Procedure: RIGHT BREAST LUMPECTOMY WITH RADIOACTIVE SEED LOCALIZATION, AND RIGHT BREAST SEED GUIDED  EXCISIONAL BIOPSY;  Surgeon: Ebbie Cough, MD;  Location: [facility];  Service: General;  Laterality: Right;   CATARACT EXTRACTION, BILATERAL     CHOLECYSTECTOMY  1972   ROTATOR CUFF REPAIR Right    TOTAL ABDOMINAL HYSTERECTOMY Liu/ BILATERAL SALPINGOOPHORECTOMY      Family History  Problem Relation Age of Onset   Parkinson's disease Mother    Alzheimer's disease Mother    Prostate cancer Father        'spread outside of prostate'   Breast cancer Sister 19   Heart attack Brother    Rheum arthritis Paternal Aunt    Pancreatic cancer Cousin        40's/50's   Anemia  Neg Hx    Arrhythmia Neg Hx    Asthma Neg Hx    Clotting disorder Neg Hx    Fainting Neg Hx    Heart disease Neg Hx    Heart failure Neg Hx    Hyperlipidemia Neg Hx    Hypertension Neg Hx     Social History:  reports that she has never smoked. She has never used smokeless tobacco. She reports current alcohol  use of about 1.0 standard drink of alcohol  per week. She reports that she does not use drugs.  Allergies:  Allergies  Allergen Reactions   Codeine Hives   Sulfa Antibiotics Rash    Prior to Admission medications   Medication Sig Start Date End Date Taking? Authorizing Provider  aspirin  EC 81 MG tablet Take 1 tablet (81 mg total) by mouth daily. Swallow whole. 09/10/22  Yes Gudena, Vinay, MD  Cholecalciferol (VITAMIN D3 PO) Take by mouth.   Yes [provider]  Cholecalciferol 50 MCG (2000 UT) CAPS 1 capsule Orally Once a day   Yes [provider]  Evolocumab  (REPATHA ) 140 MG/ML SOSY Inject 140 mg into the skin every 14 (fourteen) days. 09/09/21  Yes Gudena, Vinay, MD  Multiple Vitamins-Minerals (ICAPS AREDS 2 PO) Take 1 tablet by mouth in the morning and at bedtime.   Yes [provider]  Probiotic Product (PROBIOTIC DAILY PO) Take by mouth.   Yes [provider]  triamterene -hydrochlorothiazide (MAXZIDE -25) 37.5-25 MG tablet  07/05/19  Yes [provider]  valsartan  (DIOVAN ) 160 MG tablet Take 1 tablet (160 mg total) by mouth daily. 07/06/23  Yes [place], Peter M, MD    Blood pressure 128/76, pulse 95, SpO2 93%. Exam: General: Communicates without difficulty, well nourished, no acute distress. Head: Normocephalic, no evidence injury, no tenderness, facial buttresses intact without stepoff. Face/sinus: No tenderness to palpation and percussion. Facial movement is normal and symmetric. Eyes: PERRL, EOMI. No scleral icterus, conjunctivae clear. Neuro: CN II exam reveals vision grossly intact.  No nystagmus at any point of gaze. Ears: Auricles  well formed without lesions.  Bilateral cerumen impaction.  Nose: External evaluation reveals normal support and skin without lesions.  Dorsum is intact.  Anterior rhinoscopy reveals normal mucosa over anterior aspect of inferior turbinates and intact septum.  No purulence noted. Oral:  Oral cavity and oropharynx are intact, symmetric, without erythema or edema.  Mucosa is moist without lesions. Neck: Full range of motion without pain.  There is no significant lymphadenopathy.  No masses palpable.  Thyroid  bed within normal limits to palpation.  Parotid glands and submandibular glands equal bilaterally without mass.  Trachea is midline. Neuro:  CN 2-12 grossly intact.   Procedure: Left ear cerumen disimpaction Anesthesia: None Description: Under the operating microscope, the cerumen is carefully removed with a combination of cerumen currette, alligator forceps, and suction catheters.  After the cerumen is removed, the left tympanic membrane and middle ear space are noted to be normal.  The patient could not tolerate the right cerumen disimpaction procedure.  No mass, erythema, or lesions. The patient tolerated the procedure well.    Assessment: 1.  Bilateral cerumen impaction.  The right ear was disimpacted without difficulty.  However, the patient could not tolerate the right ear cerumen disimpaction procedure. 2.  History of congenital right ear deafness. 3.  Progressive left ear hearing loss.  She currently wears a left ear hearing aid.  Plan: 1.  Left ear cerumen disimpaction.  She is instructed to use Debrox eardrops prior to her next appointment. 2.  Audiology appointment for outpatient hearing test and hearing aid adjustment.  No audiologist is available today. 3.  The patient will return for reevaluation in 6 months, sooner if needed.  Stephanie Liu Stephanie Liu 12/22/2023, 1:25 PM

## 2023-12-23 ENCOUNTER — Ambulatory Visit (INDEPENDENT_AMBULATORY_CARE_PROVIDER_SITE_OTHER): Payer: Self-pay | Admitting: Audiology

## 2023-12-23 ENCOUNTER — Ambulatory Visit (INDEPENDENT_AMBULATORY_CARE_PROVIDER_SITE_OTHER): Admitting: Audiology

## 2023-12-23 DIAGNOSIS — H903 Sensorineural hearing loss, bilateral: Secondary | ICD-10-CM | POA: Diagnosis not present

## 2023-12-23 DIAGNOSIS — H6123 Impacted cerumen, bilateral: Secondary | ICD-10-CM | POA: Insufficient documentation

## 2023-12-23 DIAGNOSIS — H6121 Impacted cerumen, right ear: Secondary | ICD-10-CM | POA: Insufficient documentation

## 2023-12-23 NOTE — Progress Notes (Signed)
  [address], [address] [place], [place] [address] [phone]  Audiological Evaluation    Name: Stephanie Liu     DOB:   Mar 12, 1941      MRN:   [id number]                                                                                     Service Date: 12/23/2023     Accompanied by: unaccompanied   Patient comes today after Dr. Karis, ENT sent a referral for a hearing evaluation due to concerns with hearing loss.   Symptoms Yes Details  Hearing loss  [x]  Known right ear profound hearing loss for many years ( maybe all her life) possibly due to measles. Some hearing  loss in the left ear.  Tinnitus  []    Ear pain/ infections/pressure  []    Balance problems  []    Noise exposure history  []    Previous ear surgeries  []    Family history of hearing loss  []    Amplification  [x]  Wears a left aid sometimes, notices that she struggles hearing with it in crowds.  Other  []      Otoscopy: Right ear: Occluding cerumen, unable to visualize notable tympanic membrane landmarks. Left ear:  Clear external ear canal and notable landmarks visualized on the tympanic membrane. Of note- patient says she had wax removal procedure yesterday with Dr. Karis , but because it was getting uncomfortable in the right ear he recommended drops and to see him again to take care of the wax.   Tympanometry: Right ear: Could not complete the test as it was uncomfortable/painful for the patient. Left ear: Type A- Normal external ear canal volume with normal middle ear pressure and tympanic membrane compliance.    Pure tone Audiometry: Right ear- Profound ( no response ) sensorineural hearing loss from [phone] Hz.    Left ear-  Normal to moderately severe sensorineural hearing loss from 250 Hz - 8000 Hz.  Speech Audiometry: Right ear- Speech Awareness Threshold (SAT) was observed at 100 dBHL, with contralateral masking ( it was painful for the patient). Left ear-Speech Reception Threshold (SRT) was  obtained at 25 dBHL.   Word Recognition Score Tested using NU-6 (recorded) Right ear: Could not test due to degree of hearing loss. Left ear: 100% was obtained at a presentation level of 65 dBHL with contralateral masking which is deemed as  excellent.   The hearing test results were completed under headphones and results are deemed to be of good reliability. Test technique:  conventional      Recommendations: Follow up with ENT as scheduled for today. Return for a hearing evaluation if concerns with hearing changes arise or per MD recommendation. Recommend hearing aid check.    Octavian Godek MARIE LEROUX-MARTINEZ, AUD

## 2023-12-23 NOTE — Progress Notes (Signed)
  [address] [place], [place] [address] [phone]  Hearing Aid Check     Stephanie Liu comes for a scheduled appointment for a hearing aid check.    Accompanied ab:[id number]   Patient has a set of Phonak hearing aids that were programmed by Dr. Latanya at [facility] [employer].    Chief complaint: Patient reports difficulty hearing.  Actions taken: Cleaned both devices, reprogrammed based on audiogram results from today that seem o have changed in the left ear when compared to audiogram saved in the [facility] software.  Services fee: $30 was paid at checkout.  Patient was oriented about Real Ear Measurements and ideally completing them to make sure aids are fit to prescription targets. She will return if she feels she needs more adjusting and hopefully the Verifit equipment is ready to be used per IT.  Recommend: Return for a hearing aid check , as needed. Return for a hearing evaluation and to see an ENT, if concerns with hearing changes arise.    [name], AUD

## 2023-12-31 ENCOUNTER — Ambulatory Visit (HOSPITAL_COMMUNITY): Admit: 2023-12-31 | Admitting: Orthopedic Surgery

## 2023-12-31 SURGERY — ARTHROPLASTY, SHOULDER, TOTAL, REVERSE
Anesthesia: Choice | Site: Shoulder | Laterality: Right

## 2024-01-03 ENCOUNTER — Encounter: Payer: Self-pay | Admitting: Audiology

## 2024-01-17 DIAGNOSIS — M25552 Pain in left hip: Secondary | ICD-10-CM | POA: Diagnosis not present

## 2024-02-18 DIAGNOSIS — M25562 Pain in left knee: Secondary | ICD-10-CM | POA: Diagnosis not present

## 2024-02-23 DIAGNOSIS — M25562 Pain in left knee: Secondary | ICD-10-CM | POA: Diagnosis not present

## 2024-03-02 DIAGNOSIS — M25562 Pain in left knee: Secondary | ICD-10-CM | POA: Diagnosis not present

## 2024-03-03 DIAGNOSIS — M25562 Pain in left knee: Secondary | ICD-10-CM | POA: Diagnosis not present

## 2024-03-21 DIAGNOSIS — M25562 Pain in left knee: Secondary | ICD-10-CM | POA: Diagnosis not present

## 2024-03-22 DIAGNOSIS — M25562 Pain in left knee: Secondary | ICD-10-CM | POA: Diagnosis not present

## 2024-03-25 DIAGNOSIS — Z23 Encounter for immunization: Secondary | ICD-10-CM | POA: Diagnosis not present

## 2024-04-11 DIAGNOSIS — L820 Inflamed seborrheic keratosis: Secondary | ICD-10-CM | POA: Diagnosis not present

## 2024-04-11 DIAGNOSIS — D2262 Melanocytic nevi of left upper limb, including shoulder: Secondary | ICD-10-CM | POA: Diagnosis not present

## 2024-04-11 DIAGNOSIS — D2261 Melanocytic nevi of right upper limb, including shoulder: Secondary | ICD-10-CM | POA: Diagnosis not present

## 2024-04-11 DIAGNOSIS — L821 Other seborrheic keratosis: Secondary | ICD-10-CM | POA: Diagnosis not present

## 2024-06-15 ENCOUNTER — Other Ambulatory Visit: Payer: Self-pay | Admitting: Cardiology

## 2024-06-19 ENCOUNTER — Ambulatory Visit (INDEPENDENT_AMBULATORY_CARE_PROVIDER_SITE_OTHER): Admitting: Otolaryngology

## 2024-06-19 ENCOUNTER — Encounter (INDEPENDENT_AMBULATORY_CARE_PROVIDER_SITE_OTHER): Payer: Self-pay | Admitting: Otolaryngology

## 2024-06-19 VITALS — BP 134/74 | HR 98 | Ht 63.0 in | Wt 158.0 lb

## 2024-06-19 DIAGNOSIS — H6121 Impacted cerumen, right ear: Secondary | ICD-10-CM

## 2024-06-19 DIAGNOSIS — H903 Sensorineural hearing loss, bilateral: Secondary | ICD-10-CM

## 2024-06-19 NOTE — Progress Notes (Signed)
 Patient ID: Stephanie Liu, female   DOB: 02-Jun-1941, 84 y.o.   MRN: [id number]  Follow up: Right ear deafness, left ear hearing loss  History of Present Illness Stephanie Liu is an 84 year old female with longstanding right-sided deafness and progressive left-sided sensorineural hearing loss who returns today for her follow-up evaluation.  She has congenital or early childhood right-sided deafness of unclear etiology, with complete hearing loss in the right ear since a young age. She describes progressive sensorineural hearing loss in the left ear, which she refers to as her good ear.  She was fitted with a left ear hearing aid.  However, she has since lost her left ear hearing aid.  She has a history of cerumen impaction, predominantly affecting the right ear. She attempted at-home management with Debrox drops and irrigation using a syringe, assisted by her husband.   Exam: General: Communicates without difficulty, well nourished, no acute distress. Head: Normocephalic, no evidence injury, no tenderness, facial buttresses intact without stepoff. Face/sinus: No tenderness to palpation and percussion. Facial movement is normal and symmetric. Eyes: PERRL, EOMI. No scleral icterus, conjunctivae clear. Neuro: CN II exam reveals vision grossly intact.  No nystagmus at any point of gaze. Ears: Auricles well formed without lesions.  Right ear cerumen impaction.  Nose: External evaluation reveals normal support and skin without lesions.  Dorsum is intact.  Anterior rhinoscopy reveals normal mucosa over anterior aspect of inferior turbinates and intact septum.  No purulence noted. Oral:  Oral cavity and oropharynx are intact, symmetric, without erythema or edema.  Mucosa is moist without lesions. Neck: Full range of motion without pain.  There is no significant lymphadenopathy.  No masses palpable.  Thyroid  bed within normal limits to palpation.  Parotid glands and submandibular glands equal bilaterally  without mass.  Trachea is midline. Neuro:  CN 2-12 grossly intact.    Procedure: Right ear cerumen disimpaction Anesthesia: None Description: Under the operating microscope, the cerumen is carefully removed with a combination of cerumen currette, alligator forceps, and suction catheters.  After the cerumen is removed, the tympanic membrane and middle ear space are noted to be normal.  No mass, erythema, or lesions. The patient tolerated the procedure well.  Assessment and Plan Assessment & Plan Right ear cerumen impaction - Otomicroscopy with right ear cerumen disimpaction. - Advised use of Debrox several days prior to future visits to facilitate cerumen removal. - Recommended follow-up in six months for re-evaluation and cleaning as needed.  Progressive left ear hearing loss and right ear deafness She has longstanding right-sided deafness and progressive hearing loss in the left ear. She lost her left hearing aid and has not replaced it. Replacement amplification is essential given her profound right-sided deafness. Discussed hearing aid procurement options, including insurance-contracted audiologists and [facility]. - Recommended she obtain a replacement hearing aid for the left ear. - Discussed hearing aid procurement options. - Advised scheduling a hearing test with an audiologist.

## 2024-06-26 ENCOUNTER — Telehealth (INDEPENDENT_AMBULATORY_CARE_PROVIDER_SITE_OTHER): Payer: Self-pay | Admitting: Otolaryngology

## 2024-06-26 NOTE — Telephone Encounter (Signed)
 Hearing aid clearance was faxed to [facility] on 06/20/24 with patient's latest hearing test from 12/23/2023. Fax# [phone].

## 2024-07-14 ENCOUNTER — Telehealth (INDEPENDENT_AMBULATORY_CARE_PROVIDER_SITE_OTHER): Payer: Self-pay | Admitting: Otolaryngology

## 2024-07-14 NOTE — Telephone Encounter (Signed)
 Patient called in requesting a hearing test.  She is wanting to get hearing aids but wants to have her hearing tested here.  Please advise.

## 2024-07-14 NOTE — Telephone Encounter (Signed)
 Returned patient 's call, told her that was Ok to have a repeat hearing evaluation by our office. Some one will call and schedule her an appointment with audiology.

## 2024-12-25 ENCOUNTER — Ambulatory Visit (INDEPENDENT_AMBULATORY_CARE_PROVIDER_SITE_OTHER): Admitting: Otolaryngology
